# Patient Record
Sex: Male | Born: 1995 | Hispanic: No | Marital: Single | Smoking: Never smoker
Health system: Southern US, Community
[De-identification: ages and names within clinical notes are randomized; demographics above are authoritative.]

## PROBLEM LIST (undated history)

## (undated) ENCOUNTER — Emergency Department (HOSPITAL_COMMUNITY): Admission: EM | Payer: Self-pay | Source: Home / Self Care

## (undated) ENCOUNTER — Emergency Department (HOSPITAL_COMMUNITY): Admission: EM | Discharge status: Absent without leave | Payer: Self-pay

## (undated) HISTORY — PX: SHOULDER ARTHROSCOPY: SHX128

## (undated) HISTORY — PX: HAND SURGERY: SHX662

---
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
## 2014-03-21 ENCOUNTER — Emergency Department (INDEPENDENT_AMBULATORY_CARE_PROVIDER_SITE_OTHER)
Admission: EM | Admit: 2014-03-21 | Discharge: 2014-03-21 | Disposition: A | Discharge status: Home / Self Care | Payer: Self-pay | Source: Home / Self Care | Attending: Family Medicine | Admitting: Family Medicine

## 2014-03-21 ENCOUNTER — Encounter (HOSPITAL_COMMUNITY): Payer: Self-pay | Admitting: Emergency Medicine

## 2014-03-21 DIAGNOSIS — R51 Headache: Secondary | ICD-10-CM

## 2014-03-21 DIAGNOSIS — R519 Headache, unspecified: Secondary | ICD-10-CM

## 2014-03-21 MED ORDER — DICLOFENAC SODIUM 75 MG PO TBEC: 75.0000 mg | DELAYED_RELEASE_TABLET | Freq: Two times a day (BID) | ORAL | Status: AC | PRN

## 2014-03-21 NOTE — ED Notes (Signed)
Reports having a headache with dizziness since Tuesday.    Has not tried any otc meds for symptoms.     States "needs note for work"

## 2014-03-21 NOTE — ED Provider Notes (Signed)
Seth Steele is a 19 y.o. male who presents to Urgent Care today for dizzy. One and a half days ago patient developed a moderate headache and mild room spinning dizziness. The dizziness has completely resolved. No palpitations chest pains or shortness of breath. No medications tried yet. Patient is feeling a bit better now would like to return to work but requires a work note.   History reviewed. No pertinent past medical history. Past Surgical History  Procedure Laterality Date  . Hand surgery     History  Substance Use Topics  . Smoking status: Never Smoker   . Smokeless tobacco: Not on file  . Alcohol Use: No   ROS as above Medications: No current facility-administered medications for this encounter.   Current Outpatient Prescriptions  Medication Sig Dispense Refill  . diclofenac (VOLTAREN) 75 MG EC tablet Take 1 tablet (75 mg total) by mouth 2 (two) times daily as needed. 30 tablet 0   No Known Allergies   Exam:  BP 103/68 mmHg  Pulse 59  Resp 12  SpO2 100% Gen: Well NAD HEENT: EOMI,  MMM PERRL. normal tympanic membranes bilaterally. Lungs: Normal work of breathing. CTABL Heart: RRR no MRG Abd: NABS, Soft. Nondistended, Nontender Exts: Brisk capillary refill, warm and well perfused. Neuro: Alert and oriented cranial nerves II through XII are intact normal coordination strength sensation balance and gait Neck: Nontender to midline normal range of motion   No results found for this or any previous visit (from the past 24 hour(s)). No results found.  Assessment and Plan: 19 y.o. male with headache. Seems to be resolving. Treat with diclofenac. Watchful waiting follow-up as needed. Dizziness has since resolved. No significant physical exam abnormalities.  Discussed warning signs or symptoms. Please see discharge instructions. Patient expresses understanding.     Rodolph BongEvan S Sheikh Leverich, MD 03/21/14 [phone]

## 2014-03-21 NOTE — Discharge Instructions (Signed)
Thank you for coming in today. Go to the emergency room if your headache becomes excruciating or you have weakness or numbness or uncontrolled vomiting.   General Headache Without Cause A headache is pain or discomfort felt around the head or neck area. The specific cause of a headache may not be found. There are many causes and types of headaches. A few common ones are:  Tension headaches.  Migraine headaches.  Cluster headaches.  Chronic daily headaches. HOME CARE INSTRUCTIONS   Keep all follow-up appointments with your caregiver or any specialist referral.  Only take over-the-counter or prescription medicines for pain or discomfort as directed by your caregiver.  Lie down in a dark, quiet room when you have a headache.  Keep a headache journal to find out what may trigger your migraine headaches. For example, write down:  What you eat and drink.  How much sleep you get.  Any change to your diet or medicines.  Try massage or other relaxation techniques.  Put ice packs or heat on the head and neck. Use these 3 to 4 times per day for 15 to 20 minutes each time, or as needed.  Limit stress.  Sit up straight, and do not tense your muscles.  Quit smoking if you smoke.  Limit alcohol use.  Decrease the amount of caffeine you drink, or stop drinking caffeine.  Eat and sleep on a regular schedule.  Get 7 to 9 hours of sleep, or as recommended by your caregiver.  Keep lights dim if bright lights bother you and make your headaches worse. SEEK MEDICAL CARE IF:   You have problems with the medicines you were prescribed.  Your medicines are not working.  You have a change from the usual headache.  You have nausea or vomiting. SEEK IMMEDIATE MEDICAL CARE IF:   Your headache becomes severe.  You have a fever.  You have a stiff neck.  You have loss of vision.  You have muscular weakness or loss of muscle control.  You start losing your balance or have trouble  walking.  You feel faint or pass out.  You have severe symptoms that are different from your first symptoms. MAKE SURE YOU:   Understand these instructions.  Will watch your condition.  Will get help right away if you are not doing well or get worse. Document Released: 02/01/2005 Document Revised: 04/26/2011 Document Reviewed: 02/17/2011 [facility] Patient Information 2015 [place], [place]. This information is not intended to replace advice given to you by your health care provider. Make sure you discuss any questions you have with your health care provider.

## 2014-09-05 ENCOUNTER — Emergency Department (HOSPITAL_COMMUNITY)
Admission: EM | Admit: 2014-09-05 | Discharge: 2014-09-05 | Disposition: A | Discharge status: Home / Self Care | Payer: Self-pay | Attending: Emergency Medicine | Admitting: Emergency Medicine

## 2014-09-05 ENCOUNTER — Encounter (HOSPITAL_COMMUNITY): Payer: Self-pay | Admitting: *Deleted

## 2014-09-05 DIAGNOSIS — N342 Other urethritis: Secondary | ICD-10-CM | POA: Insufficient documentation

## 2014-09-05 LAB — URINE MICROSCOPIC-ADD ON

## 2014-09-05 LAB — URINALYSIS, ROUTINE W REFLEX MICROSCOPIC
BILIRUBIN URINE: NEGATIVE
Glucose, UA: NEGATIVE mg/dL
Hgb urine dipstick: NEGATIVE
Ketones, ur: 15 mg/dL — AB
Nitrite: NEGATIVE
Protein, ur: 30 mg/dL — AB
Specific Gravity, Urine: 1.022 (ref 1.005–1.030)
UROBILINOGEN UA: 1 mg/dL (ref 0.0–1.0)
pH: 7.5 (ref 5.0–8.0)

## 2014-09-05 MED ORDER — CEFTRIAXONE SODIUM 250 MG IJ SOLR
250.0000 mg | Freq: Once | INTRAMUSCULAR | Status: AC
  Administered 2014-09-05: 250 mg via INTRAMUSCULAR
  Filled 2014-09-05: qty 250

## 2014-09-05 MED ORDER — STERILE WATER FOR INJECTION IJ SOLN
INTRAMUSCULAR | Status: AC
  Administered 2014-09-05: 10 mL
  Filled 2014-09-05: qty 10

## 2014-09-05 MED ORDER — AZITHROMYCIN 250 MG PO TABS
1000.0000 mg | ORAL_TABLET | Freq: Once | ORAL | Status: AC
  Administered 2014-09-05: 1000 mg via ORAL
  Filled 2014-09-05: qty 4

## 2014-09-05 MED ORDER — CEPHALEXIN 500 MG PO CAPS: 500.0000 mg | ORAL_CAPSULE | Freq: Two times a day (BID) | ORAL | Status: DC

## 2014-09-05 NOTE — Discharge Instructions (Signed)
Urethritis °Urethritis is an inflammation of the tube through which urine exits your bladder (urethra).  °CAUSES °Urethritis is often caused by an infection in your urethra. The infection can be viral, like herpes. The infection can also be bacterial, like gonorrhea. °RISK FACTORS °Risk factors of urethritis include: °· Having sex without using a condom. °· Having multiple sexual partners. °· Having poor hygiene. °SIGNS AND SYMPTOMS °Symptoms of urethritis are less noticeable in women than in men. These symptoms include: °· Burning feeling when you urinate (dysuria). °· Discharge from your urethra. °· Blood in your urine (hematuria). °· Urinating more than usual. °DIAGNOSIS  °To confirm a diagnosis of urethritis, your health care provider will do the following: °· Ask about your sexual history. °· Perform a physical exam. °· Have you provide a sample of your urine for lab testing. °· Use a cotton swab to gently collect a sample from your urethra for lab testing. °TREATMENT  °It is important to treat urethritis. Depending on the cause, untreated urethritis may lead to serious genital infections and possibly infertility. Urethritis caused by a bacterial infection is treated with antibiotic medicine. All sexual partners must be treated.  °HOME CARE INSTRUCTIONS °· Do not have sex until the test results are known and treatment is completed, even if your symptoms go away before you finish treatment. °· If you were prescribed an antibiotic, finish it all even if you start to feel better. °SEEK MEDICAL CARE IF:  °· Your symptoms are not improved in 3 days. °· Your symptoms are getting worse. °· You develop abdominal pain or pelvic pain (in women). °· You develop joint pain. °· You have a fever. °SEEK IMMEDIATE MEDICAL CARE IF:  °· You have severe pain in the belly, back, or side. °· You have repeated vomiting. °MAKE SURE YOU: °· Understand these instructions. °· Will watch your condition. °· Will get help right away if you  are not doing well or get worse. °Document Released: 07/28/2000 Document Revised: 06/18/2013 Document Reviewed: 10/02/2012 °[employer]® Patient Information ©2015 [employer]. This information is not intended to replace advice given to you by your health care provider. Make sure you discuss any questions you have with your health care provider. ° °

## 2014-09-05 NOTE — ED Notes (Signed)
Pt had unprotected sex three days ago and now c/o dysuria and white discharge. Pt denies n/v/d

## 2014-09-05 NOTE — ED Provider Notes (Signed)
History  This chart was scribed for non-physician practitioner, Teressa Lower, FNP,working with Elwin Mocha, MD, by Karle Plumber, ED Scribe. This patient was seen in room TR09C/TR09C and the patient's care was started at 9:10 PM.   Chief Complaint  Patient presents with  . Exposure to STD  . Dysuria  . Penile Discharge   The history is provided by the patient and medical records. No language interpreter was used.    HPI Comments:  Seth Steele is a 19 y.o. male who presents to the Emergency Department complaining of dysuria that began approximately 3-4 days ago. He reports associated penile discharge. He reports having unprotected sex. He has not done anything to treat his symptoms. He denies modifying factors. He denies fever, chills, nausea or vomiting. He denies allergies to any medications. He denies h/o STDs.  History reviewed. No pertinent past medical history. History reviewed. No pertinent past surgical history. History reviewed. No pertinent family history. History  Substance Use Topics  . Smoking status: Never Smoker   . Smokeless tobacco: Not on file  . Alcohol Use: No    Review of Systems  Constitutional: Negative for fever and chills.  Gastrointestinal: Negative for nausea and vomiting.  Genitourinary: Positive for dysuria and discharge.  All other systems reviewed and are negative.   Allergies  Review of patient's allergies indicates no known allergies.  Home Medications   Prior to Admission medications   Not on File   Triage Vitals: BP 125/65 mmHg  Pulse 83  Temp(Src) 98.2 F (36.8 C) (Oral)  Resp 16  Ht 5\' 8"  (1.727 m)  Wt 150 lb (68.04 kg)  BMI 22.81 kg/m2  SpO2 100% Physical Exam  Constitutional: He is oriented to person, place, and time. He appears well-developed and well-nourished.  HENT:  Head: Normocephalic and atraumatic.  Eyes: EOM are normal.  Neck: Normal range of motion.  Cardiovascular: Normal rate.   Pulmonary/Chest: Effort  normal.  Abdominal: Soft. Bowel sounds are normal. There is no tenderness.  Genitourinary:  Exam chaperoned by ED Scribe. Green penile discharge  Musculoskeletal: Normal range of motion.  Neurological: He is alert and oriented to person, place, and time.  Skin: Skin is warm and dry.  Psychiatric: He has a normal mood and affect. His behavior is normal.  Nursing note and vitals reviewed.   ED Course  Procedures (including critical care time) DIAGNOSTIC STUDIES: Oxygen Saturation is 100% on RA, normal by my interpretation.   COORDINATION OF CARE: 9:13 PM- Will test and treat for GC/chlamydia. Pt verbalizes understanding and agrees to plan.  Medications - No data to display  Labs Review Labs Reviewed  URINALYSIS, ROUTINE W REFLEX MICROSCOPIC (NOT AT [facility]) - Abnormal; Notable for the following:    APPearance CLOUDY (*)    Ketones, ur 15 (*)    Protein, ur 30 (*)    Leukocytes, UA LARGE (*)    All other components within normal limits  URINE MICROSCOPIC-ADD ON  HIV ANTIBODY (ROUTINE TESTING)  RPR  GC/CHLAMYDIA PROBE AMP ([place]) NOT AT [facility]    Imaging Review No results found.   EKG Interpretation None      MDM   Final diagnoses:  Urethritis    Pt given rocephin and zithromax in the ed. Pt educated on safe sex practices. Will send home with keflex  I personally performed the services described in this documentation, which was scribed in my presence. The recorded information has been reviewed and is accurate.    Teressa Lower,  NP 09/05/14 2220  Elwin Mocha, MD 09/05/14 2312

## 2014-09-06 LAB — RPR: RPR: NONREACTIVE

## 2014-09-06 LAB — GC/CHLAMYDIA PROBE AMP (~~LOC~~) NOT AT ARMC
CHLAMYDIA, DNA PROBE: NEGATIVE
Neisseria Gonorrhea: POSITIVE — AB

## 2014-09-06 LAB — HIV ANTIBODY (ROUTINE TESTING W REFLEX): HIV Screen 4th Generation wRfx: NONREACTIVE

## 2014-09-09 ENCOUNTER — Telehealth (HOSPITAL_COMMUNITY): Payer: Self-pay

## 2014-09-09 NOTE — ED Notes (Signed)
Positive for gonorrhea. Treated per protocol. [employer] form faxed. Attempting to notify pt

## 2014-09-15 ENCOUNTER — Emergency Department (HOSPITAL_COMMUNITY)
Admission: EM | Admit: 2014-09-15 | Discharge: 2014-09-15 | Disposition: A | Discharge status: Home / Self Care | Payer: Medicaid Other | Attending: Emergency Medicine | Admitting: Emergency Medicine

## 2014-09-15 ENCOUNTER — Encounter (HOSPITAL_COMMUNITY): Payer: Self-pay | Admitting: *Deleted

## 2014-09-15 ENCOUNTER — Telehealth (HOSPITAL_COMMUNITY): Payer: Self-pay | Admitting: Emergency Medicine

## 2014-09-15 DIAGNOSIS — Y9389 Activity, other specified: Secondary | ICD-10-CM | POA: Insufficient documentation

## 2014-09-15 DIAGNOSIS — Y998 Other external cause status: Secondary | ICD-10-CM | POA: Insufficient documentation

## 2014-09-15 DIAGNOSIS — IMO0002 Reserved for concepts with insufficient information to code with codable children: Secondary | ICD-10-CM

## 2014-09-15 DIAGNOSIS — Y9289 Other specified places as the place of occurrence of the external cause: Secondary | ICD-10-CM | POA: Insufficient documentation

## 2014-09-15 DIAGNOSIS — W25XXXA Contact with sharp glass, initial encounter: Secondary | ICD-10-CM | POA: Insufficient documentation

## 2014-09-15 DIAGNOSIS — Z23 Encounter for immunization: Secondary | ICD-10-CM | POA: Insufficient documentation

## 2014-09-15 DIAGNOSIS — S51811A Laceration without foreign body of right forearm, initial encounter: Secondary | ICD-10-CM | POA: Insufficient documentation

## 2014-09-15 MED ORDER — TETANUS-DIPHTH-ACELL PERTUSSIS 5-2.5-18.5 LF-MCG/0.5 IM SUSP
0.5000 mL | Freq: Once | INTRAMUSCULAR | Status: AC
  Administered 2014-09-15: 0.5 mL via INTRAMUSCULAR
  Filled 2014-09-15: qty 0.5

## 2014-09-15 MED ORDER — LIDOCAINE-EPINEPHRINE 1 %-1:100000 IJ SOLN
20.0000 mL | Freq: Once | INTRAMUSCULAR | Status: AC
  Administered 2014-09-15: 20 mL
  Filled 2014-09-15: qty 1

## 2014-09-15 NOTE — ED Notes (Signed)
Declined W/C at D/C and was escorted to lobby by RN. 

## 2014-09-15 NOTE — ED Notes (Signed)
Seth Steele at bedside for Lac repair.

## 2014-09-15 NOTE — Telephone Encounter (Signed)
Unable to contact patient by phone regarding lab results. Letter sent 
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
## 2014-09-15 NOTE — Discharge Instructions (Signed)
Please monitor for signs of infection, follow-up with her primary care provider for reevaluation if any concerning signs or symptoms present. Please return to the emergency room as needed. Sutures will dissolve on their own. He is read attached information
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
## 2014-09-30 ENCOUNTER — Telehealth (HOSPITAL_COMMUNITY): Payer: Self-pay | Admitting: *Deleted

## 2014-10-14 ENCOUNTER — Encounter (HOSPITAL_COMMUNITY): Payer: Self-pay | Admitting: Emergency Medicine

## 2014-10-14 ENCOUNTER — Emergency Department (HOSPITAL_COMMUNITY)
Admission: EM | Admit: 2014-10-14 | Discharge: 2014-10-14 | Disposition: A | Discharge status: Home / Self Care | Payer: Self-pay | Attending: Emergency Medicine | Admitting: Emergency Medicine

## 2014-10-14 DIAGNOSIS — R369 Urethral discharge, unspecified: Secondary | ICD-10-CM | POA: Insufficient documentation

## 2014-10-14 LAB — RAPID HIV SCREEN (HIV 1/2 AB+AG)
HIV 1/2 ANTIBODIES: NONREACTIVE
HIV-1 P24 ANTIGEN - HIV24: NONREACTIVE

## 2014-10-14 LAB — URINE MICROSCOPIC-ADD ON

## 2014-10-14 LAB — URINALYSIS, ROUTINE W REFLEX MICROSCOPIC
BILIRUBIN URINE: NEGATIVE
Glucose, UA: NEGATIVE mg/dL
Hgb urine dipstick: NEGATIVE
Ketones, ur: NEGATIVE mg/dL
NITRITE: NEGATIVE
PH: 7 (ref 5.0–8.0)
Protein, ur: NEGATIVE mg/dL
SPECIFIC GRAVITY, URINE: 1.014 (ref 1.005–1.030)
UROBILINOGEN UA: 0.2 mg/dL (ref 0.0–1.0)

## 2014-10-14 MED ORDER — CEFTRIAXONE SODIUM 250 MG IJ SOLR
250.0000 mg | Freq: Once | INTRAMUSCULAR | Status: AC
  Administered 2014-10-14: 250 mg via INTRAMUSCULAR
  Filled 2014-10-14: qty 250

## 2014-10-14 MED ORDER — AZITHROMYCIN 250 MG PO TABS
1000.0000 mg | ORAL_TABLET | Freq: Once | ORAL | Status: AC
  Administered 2014-10-14: 1000 mg via ORAL
  Filled 2014-10-14: qty 4

## 2014-10-14 MED ORDER — ONDANSETRON 4 MG PO TBDP
4.0000 mg | ORAL_TABLET | Freq: Once | ORAL | Status: AC
  Administered 2014-10-14: 4 mg via ORAL
  Filled 2014-10-14: qty 1

## 2014-10-14 NOTE — ED Notes (Signed)
Patient advised we need a urine.   Phlebotomy on the way to get blood.

## 2014-10-14 NOTE — Discharge Instructions (Signed)

## 2014-10-14 NOTE — ED Provider Notes (Signed)
CSN: [id number]     Arrival date & time 10/14/14  1150 History  This chart was scribed for non-physician practitioner, Marlon Pel, PA-C, working with [name], MD, by Ronney Lion, ED Scribe. This patient was seen in room TR11C/TR11C and the patient's care was started at 1:17 PM.    Chief Complaint  Patient presents with  . SEXUALLY TRANSMITTED DISEASE   The history is provided by the patient. No language interpreter was used.   Seth Steele 19 y.o.male  PCP: No PCP Per Patient  Blood pressure 99/68, pulse 74, temperature 98.2 F (36.8 C), temperature source Oral, resp. rate 22, height 5\' 7"  (1.702 m), weight 160 lb (72.576 kg), SpO2 99 %.  SIGNIFICANT PMH: None CHIEF COMPLAINT: Penile discharge and burning dysuria  When: Yesterday How: Pt states he had sex 3 weeks ago and the condom broke. Yesterday, he began experiencing burning dysuria and green-yellow penile discharge. Frequency: New-onset Duration: 1 day Location: Penile Radiation: N/A Quality: Green-yellow penile discharge Alleviating factors: None  Worsening factors: None Treatments tried: None Associated Symptoms: N/A; Pt states he is otherwise asymptomatic Negative ROS: Testicular or penile pain or swelling, back pain, abdominal, nausea, vomiting, diarrhea, Confusion, diaphoresis, fever, headache, weakness (general or focal), change of vision,  neck pain, dysphagia, aphagia, chest pain, shortness of breath,  lower extremity swelling, rash.   History reviewed. No pertinent past medical history. Past Surgical History  Procedure Laterality Date  . Hand surgery     No family history on file. Social History  Substance Use Topics  . Smoking status: Never Smoker   . Smokeless tobacco: None  . Alcohol Use: No    Review of Systems A complete 10 system review of systems was obtained and all systems are negative except as noted in the HPI and PMH.    Allergies  Review of patient's allergies indicates no known  allergies.  Home Medications   Prior to Admission medications   Medication Sig Start Date End Date Taking? Authorizing Provider  diclofenac (VOLTAREN) 75 MG EC tablet Take 1 tablet (75 mg total) by mouth 2 (two) times daily as needed. 03/21/14   Rodolph Bong, MD   BP 99/68 mmHg  Pulse 74  Temp(Src) 98.2 F (36.8 C) (Oral)  Resp 22  Ht 5\' 7"  (1.702 m)  Wt 160 lb (72.576 kg)  BMI 25.05 kg/m2  SpO2 99% Physical Exam  Constitutional: He is oriented to person, place, and time. He appears well-developed and well-nourished. No distress.  HENT:  Head: Normocephalic and atraumatic.  Eyes: Conjunctivae and EOM are normal.  Neck: Neck supple. No tracheal deviation present.  Cardiovascular: Normal rate.   Pulmonary/Chest: Effort normal. No respiratory distress.  Genitourinary: Discharge found.  Musculoskeletal: Normal range of motion.  Neurological: He is alert and oriented to person, place, and time.  Skin: Skin is warm and dry.  Psychiatric: He has a normal mood and affect. His behavior is normal.  Nursing note and vitals reviewed.   ED Course  Procedures (including critical care time)  DIAGNOSTIC STUDIES: Oxygen Saturation is 99% on RA, normal by my interpretation.    COORDINATION OF CARE: 1:22 PM - Discussed treatment plan with pt at bedside which includes STD testing. Pt verbalized understanding and agreed to plan.   Labs Review Labs Reviewed  URINALYSIS, ROUTINE W REFLEX MICROSCOPIC (NOT AT [facility])  RAPID HIV SCREEN (HIV 1/2 AB+AG)  RPR  GC/CHLAMYDIA PROBE AMP () NOT AT [facility]      MDM  Final diagnoses:  Penile discharge   RPR, GC, HIV pending  Advised to practice safe sex and have all partners evaluated and treated at the local health department. Also advised to follow with the health Department in 1-2 weeks to confirm effectiveness of treatment and receive additional education/evaluation. Return precautions given.  Medications  azithromycin (ZITHROMAX)  tablet 1,000 mg (not administered)  cefTRIAXone (ROCEPHIN) injection 250 mg (not administered)  ondansetron (ZOFRAN-ODT) disintegrating tablet 4 mg (not administered)    19 y.o.[name]'s evaluation in the Emergency Department is complete. It has been determined that no acute conditions requiring further emergency intervention are present at this time. The patient/guardian have been advised of the diagnosis and plan. We have discussed signs and symptoms that warrant return to the ED, such as changes or worsening in symptoms.  Vital signs are stable at discharge. Filed Vitals:   10/14/14 1214  BP: 99/68  Pulse: 74  Temp: 98.2 F (36.8 C)  Resp: 22    Patient/guardian has voiced understanding and agreed to follow-up with the PCP or specialist.  I personally performed the services described in this documentation, which was scribed in my presence. The recorded information has been reviewed and is accurate.    Marlon Pel, PA-C 10/14/14 1327  [name], MD 10/14/14 [phone]

## 2014-10-14 NOTE — ED Notes (Signed)
Patient states condom broke when he was having sex x 3 weeks ago.  Patient states he now has discharge and burning when he urinates.

## 2014-10-15 LAB — GC/CHLAMYDIA PROBE AMP (~~LOC~~) NOT AT ARMC
CHLAMYDIA, DNA PROBE: POSITIVE — AB
Neisseria Gonorrhea: POSITIVE — AB

## 2014-10-15 LAB — RPR: RPR Ser Ql: NONREACTIVE

## 2014-10-16 ENCOUNTER — Telehealth (HOSPITAL_BASED_OUTPATIENT_CLINIC_OR_DEPARTMENT_OTHER): Payer: Self-pay | Admitting: Emergency Medicine

## 2014-10-16 ENCOUNTER — Telehealth (HOSPITAL_COMMUNITY): Payer: Self-pay | Admitting: Emergency Medicine

## 2014-10-16 NOTE — Telephone Encounter (Signed)
ID verified. patient notified of positive Gonorrhea and Chlamydia and that treatment was given while in ED. STD instructions provided, patient verbalized understanding.

## 2014-11-04 ENCOUNTER — Emergency Department (HOSPITAL_COMMUNITY)
Admission: EM | Admit: 2014-11-04 | Discharge: 2014-11-04 | Disposition: A | Discharge status: Home / Self Care | Payer: Medicaid Other | Attending: Emergency Medicine | Admitting: Emergency Medicine

## 2014-11-04 ENCOUNTER — Emergency Department (HOSPITAL_COMMUNITY): Payer: Self-pay

## 2014-11-04 ENCOUNTER — Encounter (HOSPITAL_COMMUNITY): Payer: Self-pay | Admitting: *Deleted

## 2014-11-04 DIAGNOSIS — W1839XA Other fall on same level, initial encounter: Secondary | ICD-10-CM | POA: Insufficient documentation

## 2014-11-04 DIAGNOSIS — Y998 Other external cause status: Secondary | ICD-10-CM | POA: Insufficient documentation

## 2014-11-04 DIAGNOSIS — S6991XA Unspecified injury of right wrist, hand and finger(s), initial encounter: Secondary | ICD-10-CM | POA: Insufficient documentation

## 2014-11-04 DIAGNOSIS — Y9289 Other specified places as the place of occurrence of the external cause: Secondary | ICD-10-CM | POA: Insufficient documentation

## 2014-11-04 DIAGNOSIS — Z87891 Personal history of nicotine dependence: Secondary | ICD-10-CM | POA: Insufficient documentation

## 2014-11-04 DIAGNOSIS — Y9389 Activity, other specified: Secondary | ICD-10-CM | POA: Insufficient documentation

## 2014-11-04 MED ORDER — NAPROXEN 500 MG PO TABS: 500.0000 mg | ORAL_TABLET | Freq: Two times a day (BID) | ORAL | Status: AC
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
## 2014-11-04 NOTE — ED Notes (Signed)
Pt is here with right hand and pinkie finger injury from 2 days ago when he fell on his hand.  Pt has some swelling to top of hand and has pain with trying to move pinkie.

## 2014-11-04 NOTE — ED Provider Notes (Signed)
CSN: [id number]     Arrival date & time 11/04/14  1057 History   This chart was scribed for non-physician practitioner [name], PA-C working with [name], MD by Murriel Hopper, ED Scribe. This patient was seen in room TR05C/TR05C and the patient's care was started at 2:04 PM.     Chief Complaint  Patient presents with  . Hand Injury     The history is provided by the patient. No language interpreter was used.   HPI Comments: Seth Steele is a 19 y.o. male who presents to the Emergency Department complaining of constant, worsening lateral right hand pain with associated hand swelling and numbness in his 5th digit that has been present for 2 days after pt injured it when he fell at work. Pt states he cannot work, and notes his pain worsens with movement. Pt denies doing anything to treat his pain, and states that where he is from, they do not take medication. Pt states he has never broken his hand before.  He denies any pain of the wrist or pain anywhere else.    History reviewed. No pertinent past medical history. Past Surgical History  Procedure Laterality Date  . Hand surgery     No family history on file. Social History  Substance Use Topics  . Smoking status: Former Games developer  . Smokeless tobacco: None  . Alcohol Use: No    Review of Systems  Constitutional: Negative for fever and chills.  Musculoskeletal: Positive for joint swelling and arthralgias.  Neurological: Positive for numbness.  All other systems reviewed and are negative.     Allergies  Review of patient's allergies indicates no known allergies.  Home Medications   Prior to Admission medications   Medication Sig Start Date End Date Taking? Authorizing Provider  diclofenac (VOLTAREN) 75 MG EC tablet Take 1 tablet (75 mg total) by mouth 2 (two) times daily as needed. 03/21/14   Rodolph Bong, MD   BP 124/71 mmHg  Pulse 59  Temp(Src) 97.4 F (36.3 C) (Oral)  Resp 18  SpO2 100% Physical Exam   Constitutional: He is oriented to person, place, and time. He appears well-developed and well-nourished.  HENT:  Head: Normocephalic and atraumatic.  Cardiovascular: Normal rate and regular rhythm.   Pulses:      Radial pulses are 2+ on the right side.  Pulmonary/Chest: Effort normal and breath sounds normal.  Abdominal: He exhibits no distension.  Musculoskeletal:       Right wrist: He exhibits normal range of motion, no tenderness, no bony tenderness and no swelling.  Tenderness to palpation of the 4th and 5th metacarpals with obvious deformity present Able to make a fist with the right hand  Good capillary refill of right 5th digit Decreased sensation of the right 5th digit  Neurological: He is alert and oriented to person, place, and time.  Skin: Skin is warm, dry and intact.  Psychiatric: He has a normal mood and affect.  Nursing note and vitals reviewed.   ED Course  Procedures (including critical care time)  DIAGNOSTIC STUDIES: Oxygen Saturation is 100% on room air, normal by my interpretation.    COORDINATION OF CARE: 2:08 PM Discussed treatment plan with pt at bedside and pt agreed to plan.   Labs Review Labs Reviewed - No data to display  Imaging Review Dg Hand Complete Right  11/04/2014   CLINICAL DATA:  Right hand and little finger injury 2 days ago, falling on right hand.  EXAM: RIGHT HAND -  COMPLETE 3+ VIEW  COMPARISON:  None.  FINDINGS: Old healed fractures of the fourth and fifth metacarpals. No acute fracture, subluxation or dislocation. Joint spaces are maintained.  IMPRESSION: No acute bony abnormality.   Electronically Signed   By: Charlett Nose M.D.   On: 11/04/2014 12:56   I have personally reviewed and evaluated these images and lab results as part of my medical decision-making.   EKG Interpretation None      MDM   Final diagnoses:  None  Patient presents today with right hand pain after falling while at work.  Xray negative for acute fracture,  but showing old fractures.  Patient with obvious old deformity of his hand.  Patient given velcro splint for comfort.  Patient stable for discharge.  Given referral to Hand Surgery.  Stable for discharge.  Return precautions given  I personally performed the services described in this documentation, which was scribed in my presence. The recorded information has been reviewed and is accurate.    [name], PA-C 11/05/14 2343  [name], MD 11/05/14 [phone]

## 2014-11-12 LAB — GLUCOSE, POCT (MANUAL RESULT ENTRY): POC GLUCOSE: 101 mg/dL — AB (ref 70–99)

## 2015-02-03 ENCOUNTER — Encounter (HOSPITAL_COMMUNITY): Payer: Self-pay | Admitting: Family Medicine

## 2015-02-03 ENCOUNTER — Emergency Department (HOSPITAL_COMMUNITY)
Admission: EM | Admit: 2015-02-03 | Discharge: 2015-02-03 | Disposition: A | Discharge status: Home / Self Care | Payer: Self-pay | Attending: Emergency Medicine | Admitting: Emergency Medicine

## 2015-02-03 DIAGNOSIS — R197 Diarrhea, unspecified: Secondary | ICD-10-CM | POA: Insufficient documentation

## 2015-02-03 DIAGNOSIS — R109 Unspecified abdominal pain: Secondary | ICD-10-CM | POA: Insufficient documentation

## 2015-02-03 DIAGNOSIS — R1110 Vomiting, unspecified: Secondary | ICD-10-CM | POA: Insufficient documentation

## 2015-02-03 LAB — COMPREHENSIVE METABOLIC PANEL
ALT: 53 U/L (ref 17–63)
AST: 33 U/L (ref 15–41)
Albumin: 4.4 g/dL (ref 3.5–5.0)
Alkaline Phosphatase: 122 U/L (ref 38–126)
Anion gap: 11 (ref 5–15)
BILIRUBIN TOTAL: 1.3 mg/dL — AB (ref 0.3–1.2)
BUN: 11 mg/dL (ref 6–20)
CHLORIDE: 103 mmol/L (ref 101–111)
CO2: 24 mmol/L (ref 22–32)
CREATININE: 1.23 mg/dL (ref 0.61–1.24)
Calcium: 9.3 mg/dL (ref 8.9–10.3)
Glucose, Bld: 103 mg/dL — ABNORMAL HIGH (ref 65–99)
POTASSIUM: 3.8 mmol/L (ref 3.5–5.1)
Sodium: 138 mmol/L (ref 135–145)
TOTAL PROTEIN: 7.7 g/dL (ref 6.5–8.1)

## 2015-02-03 LAB — URINALYSIS, ROUTINE W REFLEX MICROSCOPIC
GLUCOSE, UA: NEGATIVE mg/dL
Ketones, ur: 40 mg/dL — AB
NITRITE: POSITIVE — AB
PH: 5.5 (ref 5.0–8.0)
Protein, ur: 30 mg/dL — AB
Specific Gravity, Urine: 1.041 — ABNORMAL HIGH (ref 1.005–1.030)

## 2015-02-03 LAB — CBC
HCT: 48.8 % (ref 39.0–52.0)
Hemoglobin: 16.9 g/dL (ref 13.0–17.0)
MCH: 30.3 pg (ref 26.0–34.0)
MCHC: 34.6 g/dL (ref 30.0–36.0)
MCV: 87.6 fL (ref 78.0–100.0)
Platelets: 141 10*3/uL — ABNORMAL LOW (ref 150–400)
RBC: 5.57 MIL/uL (ref 4.22–5.81)
RDW: 12.1 % (ref 11.5–15.5)
WBC: 9.5 10*3/uL (ref 4.0–10.5)

## 2015-02-03 LAB — URINE MICROSCOPIC-ADD ON

## 2015-02-03 LAB — LIPASE, BLOOD: LIPASE: 34 U/L (ref 11–51)

## 2015-02-03 MED ORDER — DIPHENOXYLATE-ATROPINE 2.5-0.025 MG PO TABS: 1.0000 | ORAL_TABLET | Freq: Four times a day (QID) | ORAL | Status: AC | PRN

## 2015-02-03 MED ORDER — LOPERAMIDE HCL 2 MG PO CAPS
4.0000 mg | ORAL_CAPSULE | Freq: Once | ORAL | Status: AC
Start: 2015-02-03 — End: 2015-02-03
  Administered 2015-02-03: 4 mg via ORAL
  Filled 2015-02-03: qty 2

## 2015-02-03 MED ORDER — ONDANSETRON 4 MG PO TBDP
4.0000 mg | ORAL_TABLET | Freq: Once | ORAL | Status: AC
  Administered 2015-02-03: 4 mg via ORAL
  Filled 2015-02-03: qty 1

## 2015-02-03 MED ORDER — ONDANSETRON 4 MG PO TBDP: 4.0000 mg | ORAL_TABLET | Freq: Three times a day (TID) | ORAL | Status: AC | PRN

## 2015-02-03 NOTE — ED Notes (Signed)
Pt is in stable condition upon d/c and ambulates from ED. 

## 2015-02-03 NOTE — ED Notes (Signed)
Pt here for abd pain, headache and diarrhea since yesterday.

## 2015-02-03 NOTE — ED Provider Notes (Signed)
CSN: [id number]     Arrival date & time 02/03/15  1345 History   First MD Initiated Contact with Patient 02/03/15 1552     Chief Complaint  Patient presents with  . Diarrhea  . Abdominal Pain     (Consider location/radiation/quality/duration/timing/severity/associated sxs/prior Treatment) Patient is a 19 y.o. male presenting with diarrhea and abdominal pain. The history is provided by the patient. No language interpreter was used.  Diarrhea Quality:  Watery Severity:  Moderate Onset quality:  Gradual Number of episodes:  Multiple Duration:  2 days Timing:  Intermittent Progression:  Worsening Relieved by:  Nothing Worsened by:  Nothing tried Ineffective treatments:  None tried Associated symptoms: abdominal pain and vomiting   Risk factors: no recent antibiotic use   Abdominal Pain Associated symptoms: diarrhea and vomiting     History reviewed. No pertinent past medical history. No past surgical history on file. History reviewed. No pertinent family history. Social History  Substance Use Topics  . Smoking status: Never Smoker   . Smokeless tobacco: None  . Alcohol Use: No    Review of Systems  Gastrointestinal: Positive for vomiting, abdominal pain and diarrhea.  All other systems reviewed and are negative.     Allergies  Review of patient's allergies indicates no known allergies.  Home Medications   Prior to Admission medications   Medication Sig Start Date End Date Taking? Authorizing Provider  cephALEXin (KEFLEX) 500 MG capsule Take 1 capsule (500 mg total) by mouth 2 (two) times daily. Patient not taking: Reported on 02/03/2015 09/05/14   Teressa Lower, NP   BP 109/67 mmHg  Pulse 101  Temp(Src) 99.1 F (37.3 C) (Oral)  Resp 14  SpO2 100% Physical Exam  Constitutional: He is oriented to person, place, and time. He appears well-developed and well-nourished.  HENT:  Head: Normocephalic and atraumatic.  Right Ear: External ear normal.  Left Ear:  External ear normal.  Mouth/Throat: Oropharynx is clear and moist.  Eyes: Conjunctivae and EOM are normal. Pupils are equal, round, and reactive to light.  Neck: Normal range of motion.  Cardiovascular: Normal rate and normal heart sounds.   Pulmonary/Chest: Effort normal and breath sounds normal.  Abdominal: Soft. He exhibits no distension and no mass. There is tenderness. There is no rebound and no guarding.  diffussely tender  Musculoskeletal: Normal range of motion.  Neurological: He is alert and oriented to person, place, and time.  Psychiatric: He has a normal mood and affect.  Nursing note and vitals reviewed.   ED Course  Procedures (including critical care time) Labs Review Labs Reviewed  COMPREHENSIVE METABOLIC PANEL - Abnormal; Notable for the following:    Glucose, Bld 103 (*)    Total Bilirubin 1.3 (*)    All other components within normal limits  CBC - Abnormal; Notable for the following:    Platelets 141 (*)    All other components within normal limits  LIPASE, BLOOD  URINALYSIS, ROUTINE W REFLEX MICROSCOPIC (NOT AT [facility])    Imaging Review No results found. I have personally reviewed and evaluated these images and lab results as part of my medical decision-making.   EKG Interpretation None      MDM  Pt's symptoms consistent with viral gi virus.   Pt given rx for zofran odt.  Referral by resource guide.   Pt advised to return if symtpoms worsen or cahnge.   Final diagnoses:  Diarrhea, unspecified type    zofran odt immodium An After Visit Summary was printed and  given to the patient.    Lonia SkinnerLeslie K BiwabikSofia, PA-C 02/03/15 1730  Cathren LaineKevin Steinl, MD 02/04/15 [phone]

## 2015-02-03 NOTE — Discharge Instructions (Signed)

## 2015-09-03 ENCOUNTER — Encounter (HOSPITAL_COMMUNITY): Payer: Self-pay | Admitting: Emergency Medicine

## 2015-09-03 ENCOUNTER — Ambulatory Visit (HOSPITAL_COMMUNITY)
Admission: EM | Admit: 2015-09-03 | Discharge: 2015-09-03 | Disposition: A | Discharge status: Home / Self Care | Payer: Self-pay | Attending: Emergency Medicine | Admitting: Emergency Medicine

## 2015-09-03 DIAGNOSIS — M779 Enthesopathy, unspecified: Principal | ICD-10-CM

## 2015-09-03 DIAGNOSIS — M778 Other enthesopathies, not elsewhere classified: Secondary | ICD-10-CM

## 2015-09-03 DIAGNOSIS — M6588 Other synovitis and tenosynovitis, other site: Secondary | ICD-10-CM

## 2015-09-03 MED ORDER — MELOXICAM 15 MG PO TABS: 15.0000 mg | ORAL_TABLET | Freq: Every day | ORAL | Status: AC

## 2015-09-03 NOTE — ED Notes (Signed)
[facility] with a complaint of right hand pain secondary to an injury that occurred playing soccer a year ago.

## 2015-09-03 NOTE — Discharge Instructions (Signed)
It looks like you had a broken bone in your hand many years ago. When it healed, it created a bony prominence. This bony prominence is causing irritation to the tendons in your hand which is causing your pain. Take meloxicam daily for 1 week, then as needed for pain. Wear the wrist splint if you are going to be doing lifting at work. At the end of the day, please ice your hand for 20 minutes. Follow-up as needed.

## 2015-09-03 NOTE — ED Provider Notes (Signed)
CSN: [id number]     Arrival date & time 09/03/15  0957 History   First MD Initiated Contact with Patient 09/03/15 1015     Chief Complaint  Patient presents with  . Hand Pain   (Consider location/radiation/quality/duration/timing/severity/associated sxs/prior Treatment) HPI  He is a 20 year old man here for evaluation of right hand pain. He states many years ago he injured his hand playing soccer. At some point he had an x-ray that showed it had been broken. 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  This causes pain and discomfort on the dorsal hand between the fourth and fifth metacarpal bones.  He states if he is not lifting heavy things or doing certain movements he does not have any pain. No swelling. No new injury or trauma. He has not tried any medications. He states his job is able to move him to a different position, but requires a note.  History reviewed. No pertinent past medical history. History reviewed. No pertinent past surgical history. History reviewed. No pertinent family history. Social History  Substance Use Topics  . Smoking status: Never Smoker   . Smokeless tobacco: None  . Alcohol Use: No    Review of Systems As in history of present illness Allergies  Review of patient's allergies indicates no known allergies.  Home Medications   Prior to Admission medications   Medication Sig Start Date End Date Taking? Authorizing Provider  diphenoxylate-atropine (LOMOTIL) 2.5-0.025 MG tablet Take 1 tablet by mouth 4 (four) times daily as needed for diarrhea or loose stools. 02/03/15   Elson AreasLeslie K Sofia, PA-C  meloxicam (MOBIC) 15 MG tablet Take 1 tablet (15 mg total) by mouth daily. For 1 week, then as needed for pain 09/03/15   Charm RingsErin J Delshon Blanchfield, MD  ondansetron (ZOFRAN ODT) 4 MG disintegrating tablet Take 1 tablet (4 mg total) by mouth every 8 (eight) hours as needed for nausea or vomiting. 02/03/15   Elson AreasLeslie K  Sofia, PA-C   Meds Ordered and Administered this Visit  Medications - No data to display  BP 106/54 mmHg  Pulse 59  Temp(Src) 97.9 F (36.6 C) (Oral)  Resp 16  SpO2 99% No data found.   Physical Exam  Constitutional: He is oriented to person, place, and time. He appears well-developed and well-nourished. No distress.  Cardiovascular: Normal rate.   Pulmonary/Chest: Effort normal.  Musculoskeletal:  Right hand: No erythema or edema. He does have a bony prominence at the mid to distal fourth medical carpal.  This is nontender. He is tender in the soft tissues between the fourth and fifth metacarpals. Full active range of motion in hand and wrist. 2+ radial pulse. Brisk cap refill in all digits.  Neurological: He is alert and oriented to person, place, and time.    ED Course  Procedures (including critical care time)  Labs Review Labs Reviewed - No data to display  Imaging Review No results found.  MDM   1. Right hand tendonitis    This is likely some tendinitis coming from irritation from the bony prominence. Recommended meloxicam, wrist brace, and icing. Work note provided limiting lifting and movement of the right hand for the next 2 weeks. Follow-up as needed.    Charm RingsErin J Thao Bauza, MD 09/03/15 1032

## 2016-09-01 ENCOUNTER — Encounter (HOSPITAL_COMMUNITY): Payer: Self-pay | Admitting: Nurse Practitioner

## 2016-09-01 ENCOUNTER — Emergency Department (HOSPITAL_COMMUNITY)
Admission: EM | Admit: 2016-09-01 | Discharge: 2016-09-01 | Disposition: A | Discharge status: Home / Self Care | Payer: BLUE CROSS/BLUE SHIELD | Attending: Emergency Medicine | Admitting: Emergency Medicine

## 2016-09-01 DIAGNOSIS — R51 Headache: Secondary | ICD-10-CM | POA: Diagnosis not present

## 2016-09-01 DIAGNOSIS — Z791 Long term (current) use of non-steroidal anti-inflammatories (NSAID): Secondary | ICD-10-CM | POA: Diagnosis not present

## 2016-09-01 DIAGNOSIS — R0981 Nasal congestion: Secondary | ICD-10-CM

## 2016-09-01 DIAGNOSIS — Z79899 Other long term (current) drug therapy: Secondary | ICD-10-CM | POA: Diagnosis not present

## 2016-09-01 MED ORDER — LORATADINE 10 MG PO TABS: 10.0000 mg | ORAL_TABLET | Freq: Every day | ORAL | 0 refills | Status: AC

## 2016-09-01 MED ORDER — FLUTICASONE PROPIONATE 50 MCG/ACT NA SUSP: 1.0000 | Freq: Every day | NASAL | 2 refills | Status: AC

## 2016-09-01 NOTE — ED Triage Notes (Signed)
Pt presents with headache, rhinorrhea, and non-productive dry cough ongoing since yesterday. Pt denies sick contacts. Patient has not tried any OTC meds presently. Denies fevers, chills, nausea, light sensitivity, sinus pressure.

## 2016-09-01 NOTE — Discharge Instructions (Signed)
Please read attached information. If you experience any new or worsening signs or symptoms please return to the emergency room for evaluation. Please follow-up with your primary care provider or specialist as discussed. Please use medication prescribed only as directed and discontinue taking if you have any concerning signs or symptoms.   °

## 2016-09-01 NOTE — ED Provider Notes (Signed)
[facility]-EMERGENCY DEPT Provider Note   CSN: [id number] Arrival date & time: 09/01/16  1144     History   Chief Complaint Chief Complaint  Patient presents with  . Nasal Congestion  . Headache    HPI Seth Steele is a 21 y.o. male.  HPI   21 year old male presents today with complaints of upper respiratory congestion.  Patient reports symptoms started 2 days ago with runny nose, congestion, sore throat and dry nonproductive cough.  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  Patient denies any difficulty swallowing, chest pain or shortness of breath, denies any fever or chills.  No medications prior to arrival.  Patient reports symptoms are improving over the course of the day.  History reviewed. No pertinent past medical history.  There are no active problems to display for this patient.   Past Surgical History:  Procedure Laterality Date  . SHOULDER ARTHROSCOPY Right        Home Medications    Prior to Admission medications   Medication Sig Start Date End Date Taking? Authorizing Provider  diphenoxylate-atropine (LOMOTIL) 2.5-0.025 MG tablet Take 1 tablet by mouth 4 (four) times daily as needed for diarrhea or loose stools. 02/03/15   Elson Areas, PA-C  fluticasone (FLONASE) 50 MCG/ACT nasal spray Place 1 spray into both nostrils daily. 09/01/16   Briannie Gutierrez, Tinnie Gens, PA-C  loratadine (CLARITIN) 10 MG tablet Take 1 tablet (10 mg total) by mouth daily. 09/01/16   Zi Sek, Tinnie Gens, PA-C  meloxicam (MOBIC) 15 MG tablet Take 1 tablet (15 mg total) by mouth daily. For 1 week, then as needed for pain 09/03/15   Charm Rings, MD  ondansetron (ZOFRAN ODT) 4 MG disintegrating tablet Take 1 tablet (4 mg total) by mouth every 8 (eight) hours as needed for nausea or vomiting. 02/03/15   Elson Areas, PA-C    Family History No family history on file.  Social History Social History    Substance Use Topics  . Smoking status: Never Smoker  . Smokeless tobacco: Never Used  . Alcohol use No     Allergies   Patient has no known allergies.   Review of Systems Review of Systems  All other systems reviewed and are negative.    Physical Exam Updated Vital Signs BP (!) 108/57 (BP Location: Right Arm)   Pulse (!) 55   Temp 98 F (36.7 C) (Oral)   Resp 14   Ht 5\' 7"  (1.702 m)   Wt 68 kg (150 lb)   SpO2 100%   BMI 23.49 kg/m   Physical Exam  Constitutional: He is oriented to person, place, and time. He appears well-developed and well-nourished.  HENT:  Head: Normocephalic and atraumatic.  Right Ear: Tympanic membrane and ear canal normal.  Left Ear: Tympanic membrane and ear canal normal.  Nose: Rhinorrhea present.  Mouth/Throat: Uvula is midline, oropharynx is clear and moist and mucous membranes are normal. He does not have dentures. No oral lesions. No trismus in the jaw. Normal dentition. No dental abscesses, uvula swelling, lacerations or dental caries.  Eyes: Pupils are equal, round, and reactive to light. Conjunctivae are normal. Right eye exhibits no discharge. Left eye exhibits no discharge. No scleral icterus.  Neck: Normal range of motion. No JVD present. No tracheal deviation present.  Pulmonary/Chest: Effort normal. No stridor.  Neurological: He is alert and oriented to person, place, and time. Coordination  normal.  Psychiatric: He has a normal mood and affect. His behavior is normal. Judgment and thought content normal.  Nursing note and vitals reviewed.    ED Treatments / Results  Labs (all labs ordered are listed, but only abnormal results are displayed) Labs Reviewed - No data to display  EKG  EKG Interpretation None       Radiology No results found.  Procedures Procedures (including critical care time)  Medications Ordered in ED Medications - No data to display   Initial Impression / Assessment and Plan / ED Course  I  have reviewed the triage vital signs and the nursing notes.  Pertinent labs & imaging results that were available during my care of the patient were reviewed by me and considered in my medical decision making (see chart for details).      Final Clinical Impressions(s) / ED Diagnoses   Final diagnoses:  Nasal congestion    [age] year old male presents today with nasal congestion.  Uncertain if this is allergies versus early viral infection.  He is well-appearing in no acute distress.  No objective findings to necessitate further evaluation or management here in the ED.  He will be started on Claritin, Flonase, outpatient follow-up with strict return precautions.  Patient verbalized understanding and agreement to today's plan had no further questions or concerns the time discharge.  New Prescriptions New Prescriptions   FLUTICASONE (FLONASE) 50 MCG/ACT NASAL SPRAY    Place 1 spray into both nostrils daily.   LORATADINE (CLARITIN) 10 MG TABLET    Take 1 tablet (10 mg total) by mouth daily.     Eyvonne MechanicHedges, Aasiya Creasey, PA-C 09/01/16 1229    Raeford RazorKohut, Stephen, MD 09/11/16 1310

## 2017-01-10 ENCOUNTER — Emergency Department (HOSPITAL_COMMUNITY)
Admission: EM | Admit: 2017-01-10 | Discharge: 2017-01-10 | Disposition: A | Discharge status: Home / Self Care | Payer: BLUE CROSS/BLUE SHIELD | Attending: Physician Assistant | Admitting: Physician Assistant

## 2017-01-10 ENCOUNTER — Other Ambulatory Visit: Payer: Self-pay

## 2017-01-10 ENCOUNTER — Encounter (HOSPITAL_COMMUNITY): Payer: Self-pay

## 2017-01-10 DIAGNOSIS — R1084 Generalized abdominal pain: Secondary | ICD-10-CM | POA: Diagnosis not present

## 2017-01-10 DIAGNOSIS — Z87891 Personal history of nicotine dependence: Secondary | ICD-10-CM | POA: Insufficient documentation

## 2017-01-10 DIAGNOSIS — Z79899 Other long term (current) drug therapy: Secondary | ICD-10-CM | POA: Diagnosis not present

## 2017-01-10 DIAGNOSIS — R109 Unspecified abdominal pain: Secondary | ICD-10-CM | POA: Diagnosis present

## 2017-01-10 LAB — COMPREHENSIVE METABOLIC PANEL
ALBUMIN: 4.4 g/dL (ref 3.5–5.0)
ALT: 52 U/L (ref 17–63)
ANION GAP: 7 (ref 5–15)
AST: 32 U/L (ref 15–41)
Alkaline Phosphatase: 86 U/L (ref 38–126)
BUN: 11 mg/dL (ref 6–20)
CALCIUM: 9.4 mg/dL (ref 8.9–10.3)
CHLORIDE: 103 mmol/L (ref 101–111)
CO2: 28 mmol/L (ref 22–32)
Creatinine, Ser: 0.86 mg/dL (ref 0.61–1.24)
GFR calc non Af Amer: >60 mL/min (ref >60)
GLUCOSE: 96 mg/dL (ref 65–99)
POTASSIUM: 4.2 mmol/L (ref 3.5–5.1)
SODIUM: 138 mmol/L (ref 135–145)
Total Bilirubin: 0.9 mg/dL (ref 0.3–1.2)
Total Protein: 7.5 g/dL (ref 6.5–8.1)

## 2017-01-10 LAB — CBC
HEMATOCRIT: 45.4 % (ref 39.0–52.0)
HEMOGLOBIN: 15.5 g/dL (ref 13.0–17.0)
MCH: 29.8 pg (ref 26.0–34.0)
MCHC: 34.1 g/dL (ref 30.0–36.0)
MCV: 87.3 fL (ref 78.0–100.0)
Platelets: 167 10*3/uL (ref 150–400)
RBC: 5.2 MIL/uL (ref 4.22–5.81)
RDW: 12.4 % (ref 11.5–15.5)
WBC: 4.8 10*3/uL (ref 4.0–10.5)

## 2017-01-10 LAB — LIPASE, BLOOD: LIPASE: 36 U/L (ref 11–51)

## 2017-01-10 LAB — URINALYSIS, ROUTINE W REFLEX MICROSCOPIC
Bilirubin Urine: NEGATIVE
Glucose, UA: NEGATIVE mg/dL
HGB URINE DIPSTICK: NEGATIVE
Ketones, ur: NEGATIVE mg/dL
Leukocytes, UA: NEGATIVE
Nitrite: NEGATIVE
PH: 5 (ref 5.0–8.0)
Protein, ur: NEGATIVE mg/dL
SPECIFIC GRAVITY, URINE: 1.025 (ref 1.005–1.030)

## 2017-01-10 MED ORDER — DICYCLOMINE HCL 20 MG PO TABS: 20.0000 mg | ORAL_TABLET | Freq: Two times a day (BID) | ORAL | 0 refills | Status: AC

## 2017-01-10 MED ORDER — DICYCLOMINE HCL 10 MG PO CAPS
20.0000 mg | ORAL_CAPSULE | Freq: Once | ORAL | Status: AC
  Administered 2017-01-10: 20 mg via ORAL
  Filled 2017-01-10: qty 2

## 2017-01-10 NOTE — ED Provider Notes (Signed)
[facility] Provider Note   CSN: [id number] Arrival date & time: 01/10/17  0745     History   Chief Complaint Chief Complaint  Patient presents with  . Abdominal Pain    HPI Seth Steele is a 21 y.o. male.  HPI   Seth Steele is a 21 y.o. male, patient with no pertinent past medical history, presenting to the ED with intermittent abdominal discomfort and diarrhea for past two months.  Discomfort is cramping, generalized abdomen, mild to moderate, nonradiating. Pain comes on with eating and resolves with BM. Has been drinking more water during this time and states he has been urinating more as well.   Denies fever/chills, dysuria, hematuria, back pain, N/V, hematochezia/melena, pain with BM, or any other complaints.   History reviewed. No pertinent past medical history.  There are no active problems to display for this patient.   Past Surgical History:  Procedure Laterality Date  . HAND SURGERY         Home Medications    Prior to Admission medications   Medication Sig Start Date End Date Taking? Authorizing Provider  diclofenac (VOLTAREN) 75 MG EC tablet Take 1 tablet (75 mg total) by mouth 2 (two) times daily as needed. 03/21/14   Rodolph Bongorey, Evan S, MD  naproxen (NAPROSYN) 500 MG tablet Take 1 tablet (500 mg total) by mouth 2 (two) times daily. 11/04/14   Santiago GladLaisure, Heather, PA-C    Family History No family history on file.  Social History Social History   Tobacco Use  . Smoking status: Former Smoker  Substance Use Topics  . Alcohol use: No  . Drug use: No     Allergies   Patient has no known allergies.   Review of Systems Review of Systems  Constitutional: Negative for chills and fever.  Respiratory: Negative for shortness of breath.   Cardiovascular: Negative for chest pain.  Gastrointestinal: Positive for abdominal pain and diarrhea. Negative for blood in stool, nausea and vomiting.  Genitourinary: Negative for  dysuria and hematuria.  Musculoskeletal: Negative for back pain.  Neurological: Negative for dizziness and light-headedness.  All other systems reviewed and are negative.    Physical Exam Updated Vital Signs BP 103/68 (BP Location: Right Arm)   Pulse 62   Temp 98.2 F (36.8 C) (Oral)   Resp 16   Ht 5\' 6"  (1.676 m)   Wt 68 kg (150 lb)   SpO2 100%   BMI 24.21 kg/m   Physical Exam  Constitutional: He appears well-developed and well-nourished. No distress.  HENT:  Head: Normocephalic and atraumatic.  Eyes: Conjunctivae are normal.  Neck: Neck supple.  Cardiovascular: Normal rate, regular rhythm, normal heart sounds and intact distal pulses.  Pulmonary/Chest: Effort normal and breath sounds normal. No respiratory distress.  Abdominal: Soft. Bowel sounds are normal. There is no tenderness. There is no guarding.  Patient is noted to be lounging on the bed playing with his phone.  He changes positions without noted hesitation or signs of discomfort.  Musculoskeletal: He exhibits no edema.  Lymphadenopathy:    He has no cervical adenopathy.  Neurological: He is alert.  Skin: Skin is warm and dry. Capillary refill takes less than 2 seconds. He is not diaphoretic.  Psychiatric: He has a normal mood and affect. His behavior is normal.  Nursing note and vitals reviewed.    ED Treatments / Results  Labs (all labs ordered are listed, but only abnormal results are displayed) Labs Reviewed  LIPASE,  BLOOD  COMPREHENSIVE METABOLIC PANEL  CBC  URINALYSIS, ROUTINE W REFLEX MICROSCOPIC    EKG  EKG Interpretation None       Radiology No results found.  Procedures Procedures (including critical care time)  Medications Ordered in ED Medications  dicyclomine (BENTYL) capsule 20 mg (not administered)     Initial Impression / Assessment and Plan / ED Course  I have reviewed the triage vital signs and the nursing notes.  Pertinent labs & imaging results that were available  during my care of the patient were reviewed by me and considered in my medical decision making (see chart for details).  Clinical Course as of Jan 10 1058  Mon Jan 10, 2017  1033 Patient not yet in the room.  [SJ]    Clinical Course User Index [SJ] Keona Sheffler C, PA-C    Patient presents with intermittent abdominal pain and loose stools. Patient is nontoxic appearing, afebrile, not tachycardic, not tachypneic, and is in no apparent distress.  Lab results reassuring.  Doubt surgical abdomen.  Able to tolerate PO.  PCP versus GI follow-up.  Resources given. The patient was given instructions for home care as well as return precautions. Patient voices understanding of these instructions, accepts the plan, and is comfortable with discharge.     Final Clinical Impressions(s) / ED Diagnoses   Final diagnoses:  Generalized abdominal pain    ED Discharge Orders    None       Concepcion LivingJoy, Khalif Stender C, PA-C 01/10/17 1059    Mackuen, Cindee Saltourteney Lyn, MD 01/11/17 [phone]
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
## 2017-01-10 NOTE — Discharge Instructions (Addendum)
Your symptoms may be connected with diet.  Your lab results today were reassuring. Continue to drink adequate amounts of water to stay well hydrated.  May use the Bentyl, as needed for abdominal discomfort. Please follow the dietary instructions attached.  Start with a clear liquid diet and then progressed to a full liquid and then a bland, solid diet as your symptoms allow. Follow-up with primary care provider and/or the gastroenterologist on this matter.

## 2017-05-13 IMAGING — DX DG HAND COMPLETE 3+V*R*
3 series · 3 of 3 positions shown · non-contrast
Comparison: None.

CLINICAL DATA: Right hand and little finger injury 2 days ago,
falling on right hand.

EXAM:
RIGHT HAND - COMPLETE 3+ VIEW

[hand ap]
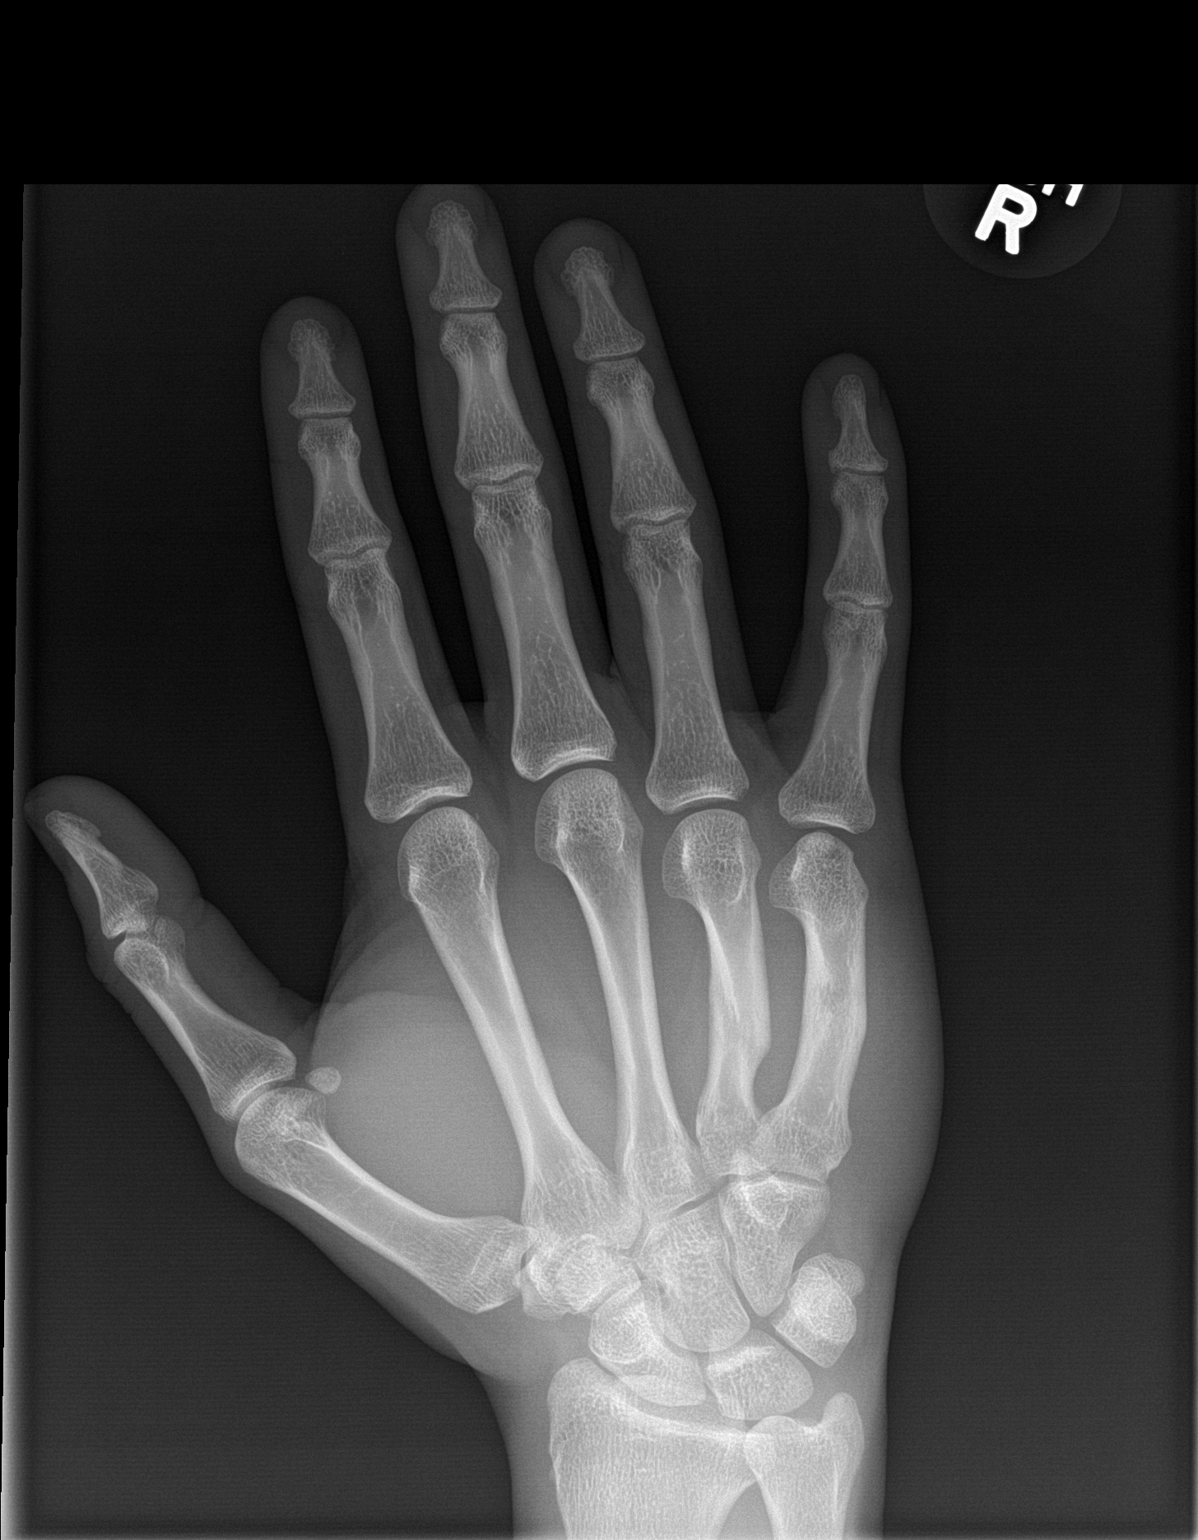

[hand obl]
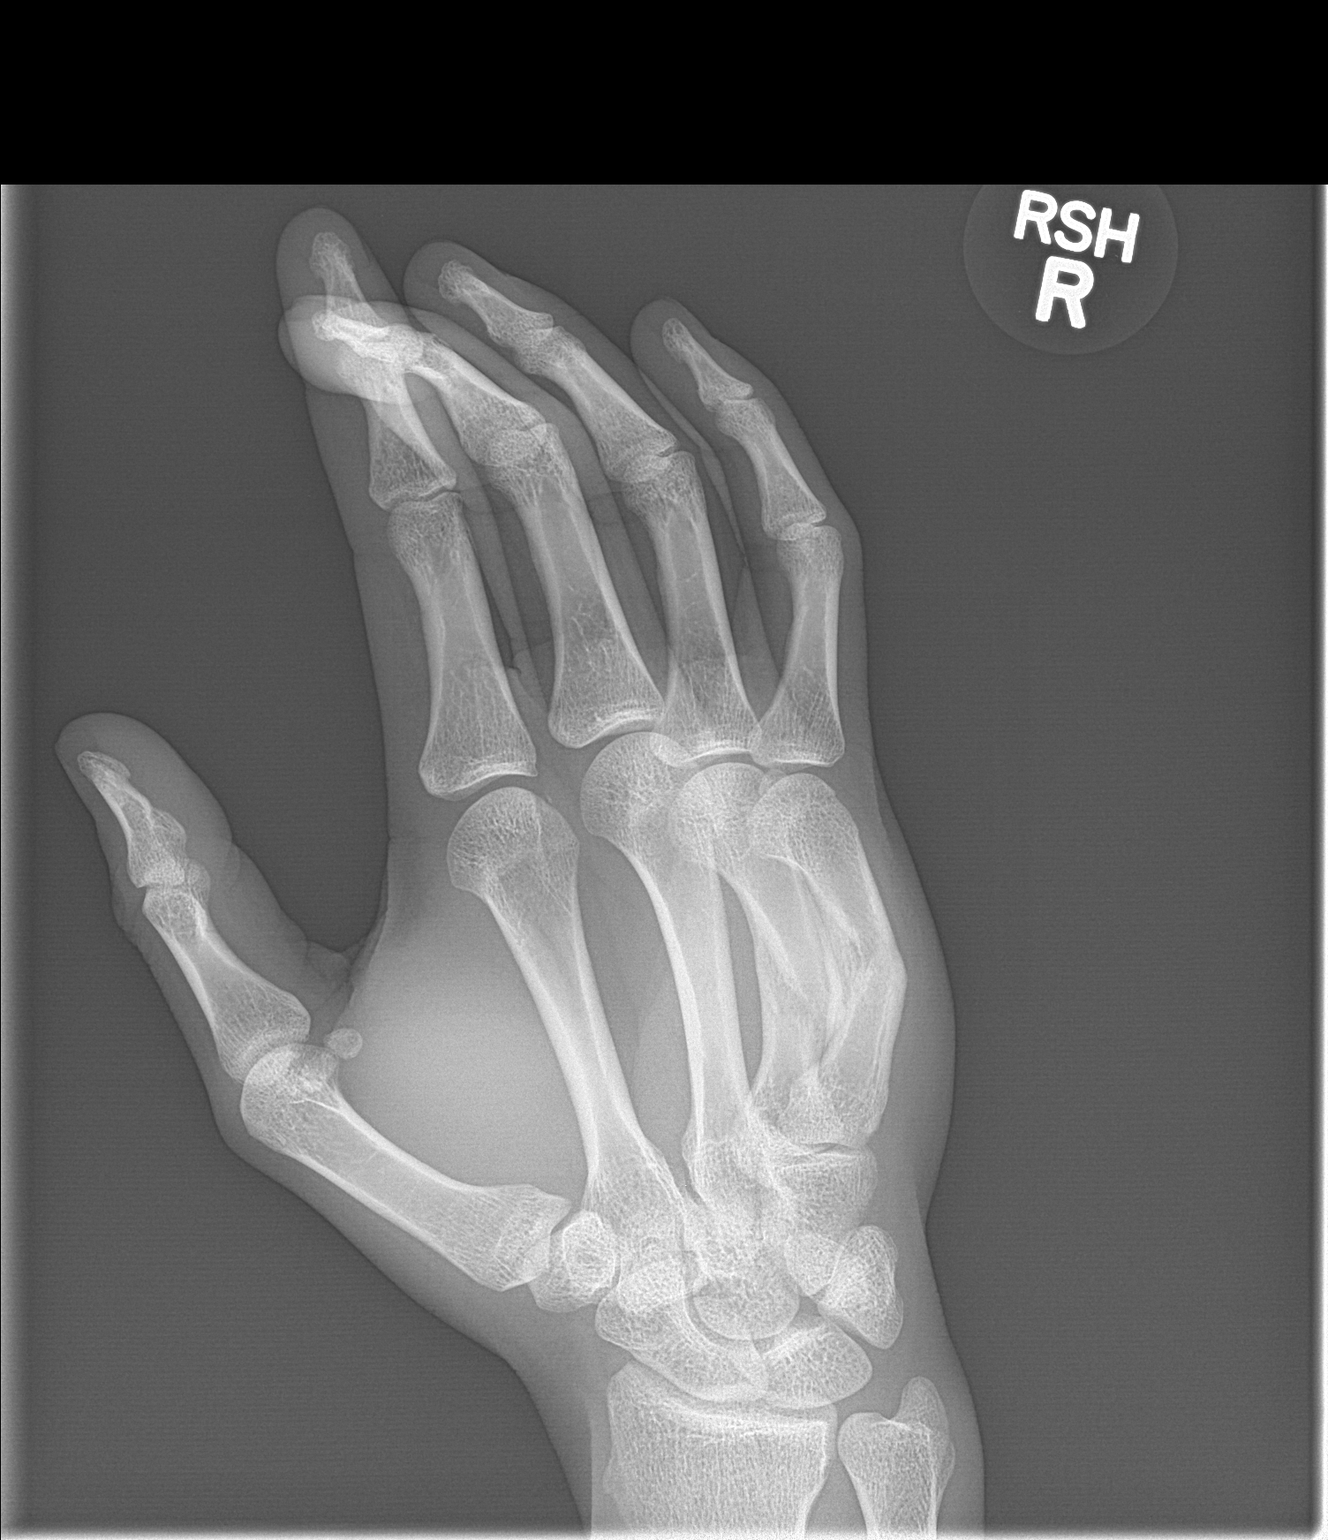

[hand lat]
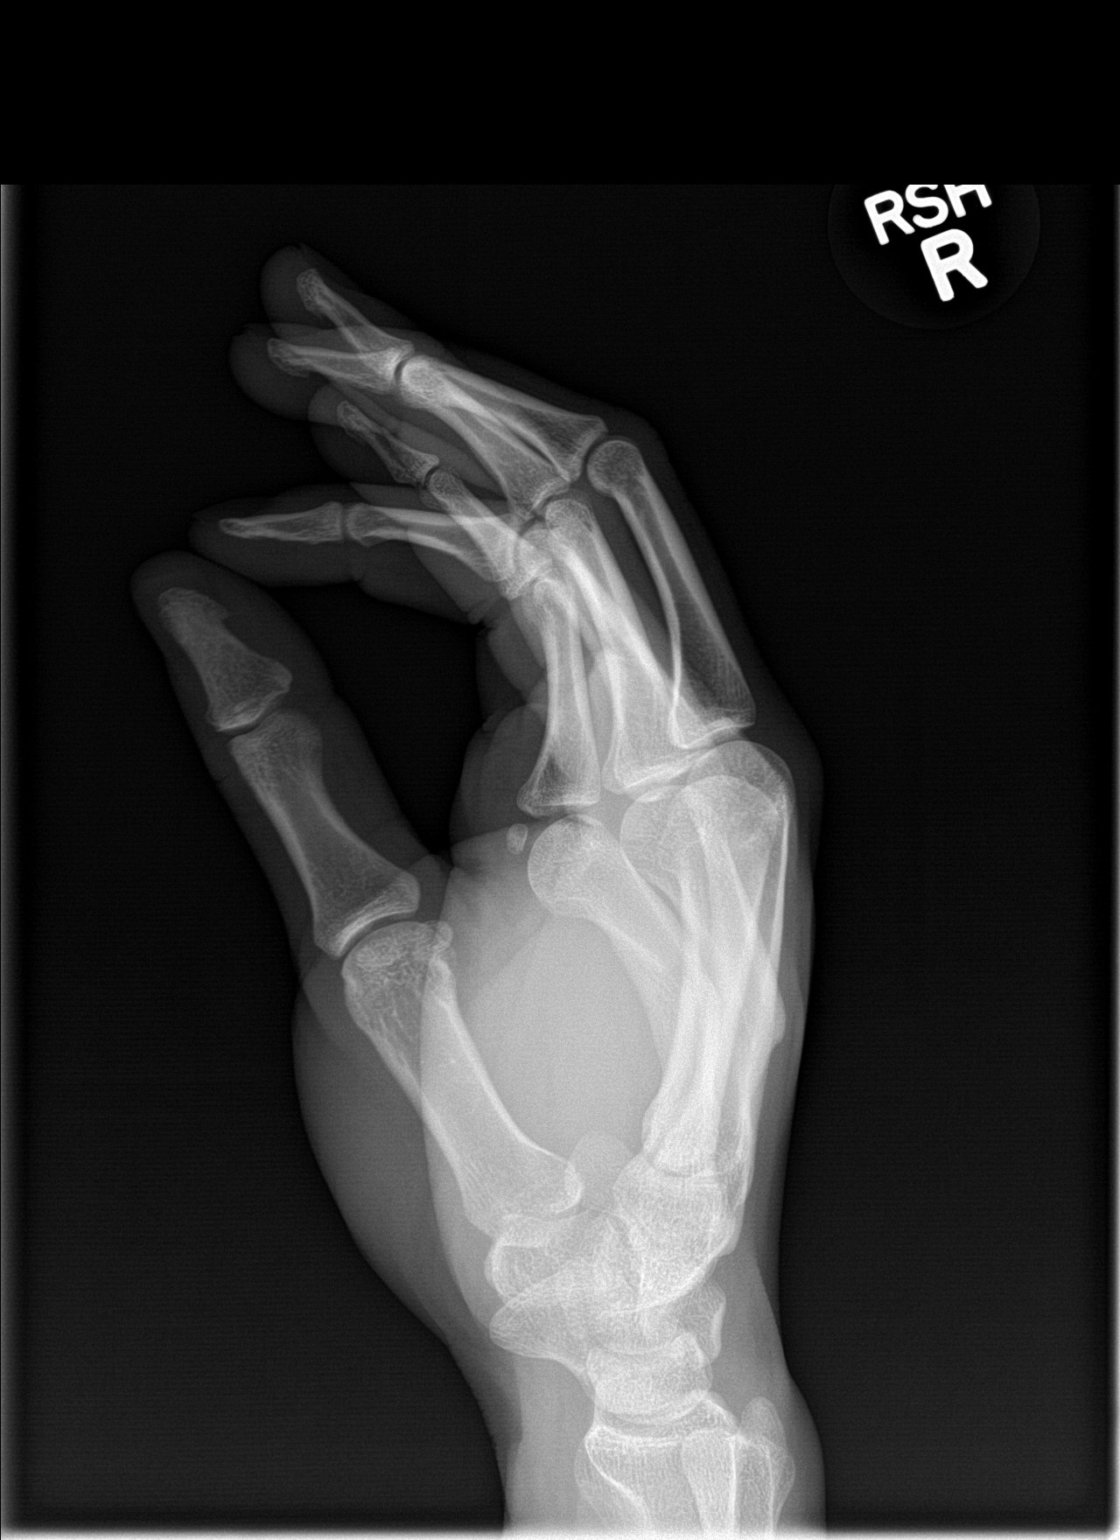

[3 of 3 positions shown; findings below may reference images not displayed]

FINDINGS: Old healed fractures of the fourth and fifth metacarpals. No acute
fracture, subluxation or dislocation. Joint spaces are maintained.
IMPRESSION: No acute bony abnormality.

## 2017-05-21 ENCOUNTER — Emergency Department (HOSPITAL_COMMUNITY)
Admission: EM | Admit: 2017-05-21 | Discharge: 2017-05-21 | Disposition: A | Discharge status: Home / Self Care | Payer: BLUE CROSS/BLUE SHIELD | Attending: Emergency Medicine | Admitting: Emergency Medicine

## 2017-05-21 ENCOUNTER — Encounter (HOSPITAL_COMMUNITY): Payer: Self-pay | Admitting: *Deleted

## 2017-05-21 ENCOUNTER — Other Ambulatory Visit: Payer: Self-pay

## 2017-05-21 DIAGNOSIS — Z202 Contact with and (suspected) exposure to infections with a predominantly sexual mode of transmission: Secondary | ICD-10-CM | POA: Diagnosis not present

## 2017-05-21 DIAGNOSIS — R300 Dysuria: Secondary | ICD-10-CM | POA: Diagnosis present

## 2017-05-21 DIAGNOSIS — Z79899 Other long term (current) drug therapy: Secondary | ICD-10-CM | POA: Diagnosis not present

## 2017-05-21 DIAGNOSIS — L299 Pruritus, unspecified: Secondary | ICD-10-CM

## 2017-05-21 LAB — URINALYSIS, ROUTINE W REFLEX MICROSCOPIC
Bilirubin Urine: NEGATIVE
Glucose, UA: NEGATIVE mg/dL
Hgb urine dipstick: NEGATIVE
Ketones, ur: NEGATIVE mg/dL
Leukocytes, UA: NEGATIVE
NITRITE: NEGATIVE
PH: 7 (ref 5.0–8.0)
Protein, ur: NEGATIVE mg/dL
SPECIFIC GRAVITY, URINE: 1.018 (ref 1.005–1.030)

## 2017-05-21 MED ORDER — AZITHROMYCIN 250 MG PO TABS
1000.0000 mg | ORAL_TABLET | Freq: Once | ORAL | Status: AC
Start: 2017-05-21 — End: 2017-05-21
  Administered 2017-05-21: 1000 mg via ORAL
  Filled 2017-05-21: qty 4

## 2017-05-21 MED ORDER — LORATADINE 10 MG PO TABS: 10.0000 mg | ORAL_TABLET | Freq: Every day | ORAL | 0 refills | Status: AC

## 2017-05-21 MED ORDER — STERILE WATER FOR INJECTION IJ SOLN
INTRAMUSCULAR | Status: AC
  Filled 2017-05-21: qty 10

## 2017-05-21 MED ORDER — CEFTRIAXONE SODIUM 250 MG IJ SOLR
250.0000 mg | Freq: Once | INTRAMUSCULAR | Status: AC
  Administered 2017-05-21: 250 mg via INTRAMUSCULAR
  Filled 2017-05-21: qty 250

## 2017-05-21 NOTE — ED Provider Notes (Signed)
[facility] EMERGENCY DEPARTMENT Provider Note   CSN: [id number] Arrival date & time: 05/21/17  1624     History   Chief Complaint Chief Complaint  Patient presents with  . Dysuria    HPI Seth Steele is a 22 y.o. male presenting for evaluation of dysuria and generalized itching.  Patient states that on Monday, he started to develop dysuria.  He has irritation at the tip of his penis every time he urinates.  Today the irritation was worse.  He noticed white drainage from his penis earlier today.  He states he had similar symptoms in 2015 when he was diagnosed with an STD.  He denies fevers, chills, nausea, vomiting, abdominal pain.  He denies hematuria or urinary frequency.  He is sexually active with one male partner who is asymptommatic.  Additionally, patient states that he has had generalized body itching over the past week.  He started using a new body wash, think this might be contributing.  No other new environments, detergents, soaps, shampoos, or exposures.  No history of similar.  He has not taken anything for it.  No one else at home with a rash.  No known allergies.  HPI  History reviewed. No pertinent past medical history.  There are no active problems to display for this patient.   Past Surgical History:  Procedure Laterality Date  . HAND SURGERY          Home Medications    Prior to Admission medications   Medication Sig Start Date End Date Taking? Authorizing Provider  bismuth subsalicylate (PEPTO BISMOL) 262 MG/15ML suspension Take 30 mLs by mouth every 6 (six) hours as needed for indigestion.    [provider]  calcium carbonate (TUMS - DOSED IN MG ELEMENTAL CALCIUM) 500 MG chewable tablet Chew 1 tablet by mouth daily as needed for indigestion or heartburn.    [provider]  diclofenac (VOLTAREN) 75 MG EC tablet Take 1 tablet (75 mg total) by mouth 2 (two) times daily as needed. Patient not taking: Reported on  01/10/2017 03/21/14   Rodolph Bong, MD  dicyclomine (BENTYL) 20 MG tablet Take 1 tablet (20 mg total) by mouth 2 (two) times daily. 01/10/17   Joy, Shawn C, PA-C  loratadine (CLARITIN) 10 MG tablet Take 1 tablet (10 mg total) by mouth daily. 05/21/17   Henryetta Corriveau, PA-C  naproxen (NAPROSYN) 500 MG tablet Take 1 tablet (500 mg total) by mouth 2 (two) times daily. Patient not taking: Reported on 01/10/2017 11/04/14   [name], PA-C    Family History No family history on file.  Social History Social History   Tobacco Use  . Smoking status: Never Smoker  . Smokeless tobacco: Never Used  Substance Use Topics  . Alcohol use: No  . Drug use: No     Allergies   Patient has no known allergies.   Review of Systems Review of Systems  Constitutional: Negative for chills and fever.  Genitourinary: Positive for dysuria. Negative for frequency and hematuria.  Skin: Positive for rash.     Physical Exam Updated Vital Signs BP 106/61   Pulse 70   Temp 98.5 F (36.9 C)   Resp 16   Ht 5\' 6"  (1.676 m)   Wt 68 kg (150 lb)   SpO2 98%   BMI 24.21 kg/m   Physical Exam  Constitutional: He is oriented to person, place, and time. He appears well-developed and well-nourished. No distress.  HENT:  Head: Normocephalic  and atraumatic.  Eyes: EOM are normal.  Neck: Normal range of motion.  Cardiovascular: Normal rate, regular rhythm and intact distal pulses.  Pulmonary/Chest: Effort normal and breath sounds normal. No respiratory distress. He has no wheezes.  Abdominal: Soft. He exhibits no distension and no mass. There is no tenderness. There is no guarding.  Genitourinary: Testes normal and penis normal.  Genitourinary Comments: Chaperone present.  No tenderness to palpation of the penis or testicles.  No obvious discharge.  No inguinal lymphadenopathy.  Musculoskeletal: Normal range of motion.  Neurological: He is alert and oriented to person, place, and time.  Skin: Skin is  warm.  No obvious rash noted.  3 or 4 discrete lesions noted on bilateral hands.    Psychiatric: He has a normal mood and affect.  Nursing note and vitals reviewed.    ED Treatments / Results  Labs (all labs ordered are listed, but only abnormal results are displayed) Labs Reviewed  URINALYSIS, ROUTINE W REFLEX MICROSCOPIC  GC/CHLAMYDIA PROBE AMP ([place]    EKG None  Radiology No results found.  Procedures Procedures (including critical care time)  Medications Ordered in ED Medications  sterile water (preservative free) injection (has no administration in time range)  cefTRIAXone (ROCEPHIN) injection 250 mg (250 mg Intramuscular Given 05/21/17 1858)  azithromycin (ZITHROMAX) tablet 1,000 mg (1,000 mg Oral Given 05/21/17 1858)     Initial Impression / Assessment and Plan / ED Course  I have reviewed the triage vital signs and the nursing notes.  Pertinent labs & imaging results that were available during my care of the patient were reviewed by me and considered in my medical decision making (see chart for details).     Patient presenting for evaluation of dysuria and itching.  Physical exam shows no concerning lesions or discharge in the penis.  As patient is reporting discharge and concern for STD, will treat today.  Discussed that results for gonorrhea and chlamydia are pending.  Discussed that his partner should follow-up if results are positive.  Additionally, patient presenting for itching.  No significant rash found.  No sign of anaphylaxis.  Will treat with Claritin and patient to stop using new body wash.  At this time, patient appears safe for discharge.  Return precautions given.  Patient states he understands and agrees to plan.   Final Clinical Impressions(s) / ED Diagnoses   Final diagnoses:  Dysuria  Itching  Possible exposure to STD    ED Discharge Orders        Ordered    loratadine (CLARITIN) 10 MG tablet  Daily     05/21/17 1854         Alveria ApleyCaccavale, Vianna Venezia, PA-C 05/21/17 2100    Nira Connardama, Pedro Eduardo, MD 05/22/17 1309

## 2017-05-21 NOTE — Discharge Instructions (Signed)
Take Claritin once a day to help with itching. Stop using the new body soap. You will receive a phone call if the results of your gonorrhea and Chlamydia tests were positive today.  If they were negative, you will not receive a phone call.  Abstain from sexual intercourse for the next 2 weeks to prevent reinfection.  If results are positive, your partner should also get treatment.  This can be done at the health department. Return to the emergency room if you develop fevers, vomiting, difficulty breathing, or any new or concerning symptoms.

## 2017-05-21 NOTE — ED Triage Notes (Signed)
The pt has had painful urination since Monday and he is  Itching over his body

## 2017-05-23 LAB — GC/CHLAMYDIA PROBE AMP (~~LOC~~) NOT AT ARMC
Chlamydia: NEGATIVE
NEISSERIA GONORRHEA: NEGATIVE

## 2017-05-24 ENCOUNTER — Encounter (HOSPITAL_COMMUNITY): Payer: Self-pay | Admitting: *Deleted

## 2017-05-30 ENCOUNTER — Other Ambulatory Visit: Payer: Self-pay

## 2017-05-30 ENCOUNTER — Emergency Department (HOSPITAL_COMMUNITY)
Admission: EM | Admit: 2017-05-30 | Discharge: 2017-05-30 | Disposition: A | Discharge status: Home / Self Care | Payer: BLUE CROSS/BLUE SHIELD | Attending: Physician Assistant | Admitting: Physician Assistant

## 2017-05-30 DIAGNOSIS — Z79899 Other long term (current) drug therapy: Secondary | ICD-10-CM | POA: Diagnosis not present

## 2017-05-30 DIAGNOSIS — J069 Acute upper respiratory infection, unspecified: Secondary | ICD-10-CM | POA: Insufficient documentation

## 2017-05-30 DIAGNOSIS — R0981 Nasal congestion: Secondary | ICD-10-CM | POA: Diagnosis present

## 2017-05-30 NOTE — ED Triage Notes (Signed)
pt has productive cough, nasal congestion and sore throat x 3days.

## 2017-05-30 NOTE — ED Provider Notes (Signed)
[facility] EMERGENCY DEPARTMENT Provider Note   CSN: [id number] Arrival date & time: 05/30/17  7829     History   Chief Complaint Chief Complaint  Patient presents with  . URI    HPI Seth Steele is a 22 y.o. male presenting to the ED with 3 days of nasal congestion and cough.  Patient states symptoms started gradually and have been persistent.  He has taken over-the-counter cold medicine without much relief.  He states his throat is mildly sore, denies any significant productive sputum to his cough.  Denies fever or chills, ear pain, difficulty breathing or swallowing.  Up-to-date on immunizations.  The history is provided by the patient.    No past medical history on file.  There are no active problems to display for this patient.   Past Surgical History:  Procedure Laterality Date  . HAND SURGERY    . SHOULDER ARTHROSCOPY Right         Home Medications    Prior to Admission medications   Medication Sig Start Date End Date Taking? Authorizing Provider  bismuth subsalicylate (PEPTO BISMOL) 262 MG/15ML suspension Take 30 mLs by mouth every 6 (six) hours as needed for indigestion.    [provider]  calcium carbonate (TUMS - DOSED IN MG ELEMENTAL CALCIUM) 500 MG chewable tablet Chew 1 tablet by mouth daily as needed for indigestion or heartburn.    [provider]  diclofenac (VOLTAREN) 75 MG EC tablet Take 1 tablet (75 mg total) by mouth 2 (two) times daily as needed. Patient not taking: Reported on 01/10/2017 03/21/14   Rodolph Bong, MD  dicyclomine (BENTYL) 20 MG tablet Take 1 tablet (20 mg total) by mouth 2 (two) times daily. 01/10/17   Joy, Shawn C, PA-C  diphenoxylate-atropine (LOMOTIL) 2.5-0.025 MG tablet Take 1 tablet by mouth 4 (four) times daily as needed for diarrhea or loose stools. 02/03/15   Elson Areas, PA-C  fluticasone (FLONASE) 50 MCG/ACT nasal spray Place 1 spray into both nostrils daily. 09/01/16   Hedges,  Tinnie Gens, PA-C  loratadine (CLARITIN) 10 MG tablet Take 1 tablet (10 mg total) by mouth daily. 09/01/16   Hedges, Tinnie Gens, PA-C  loratadine (CLARITIN) 10 MG tablet Take 1 tablet (10 mg total) by mouth daily. 05/21/17   Caccavale, Sophia, PA-C  meloxicam (MOBIC) 15 MG tablet Take 1 tablet (15 mg total) by mouth daily. For 1 week, then as needed for pain 09/03/15   Charm Rings, MD  naproxen (NAPROSYN) 500 MG tablet Take 1 tablet (500 mg total) by mouth 2 (two) times daily. Patient not taking: Reported on 01/10/2017 11/04/14   [name], PA-C  ondansetron (ZOFRAN ODT) 4 MG disintegrating tablet Take 1 tablet (4 mg total) by mouth every 8 (eight) hours as needed for nausea or vomiting. 02/03/15   Elson Areas, PA-C    Family History No family history on file.  Social History Social History   Tobacco Use  . Smoking status: Never Smoker  . Smokeless tobacco: Never Used  Substance Use Topics  . Alcohol use: No  . Drug use: No     Allergies   Patient has no known allergies.   Review of Systems Review of Systems  Constitutional: Negative for chills and fever.  HENT: Positive for congestion and sore throat. Negative for ear pain, trouble swallowing and voice change.   Respiratory: Positive for cough. Negative for shortness of breath.      Physical Exam Updated Vital Signs  BP 123/61 (BP Location: Right Arm)   Pulse 60   Temp 97.7 F (36.5 C) (Oral)   Resp 16   SpO2 99%   Physical Exam  Constitutional: He appears well-developed and well-nourished. No distress.  Well-appearing, tolerating secretions.  HENT:  Head: Normocephalic and atraumatic.  Right Ear: Tympanic membrane, external ear and ear canal normal.  Left Ear: Tympanic membrane, external ear and ear canal normal.  Mouth/Throat: Uvula is midline. No trismus in the jaw. No uvula swelling. Posterior oropharyngeal erythema present. No posterior oropharyngeal edema. No tonsillar exudate.  Nose is congested  Eyes:  Conjunctivae are normal.  Neck: Normal range of motion. Neck supple.  Cardiovascular: Normal rate, regular rhythm, normal heart sounds and intact distal pulses.  Pulmonary/Chest: Effort normal and breath sounds normal. No stridor. No respiratory distress. He has no wheezes. He has no rales.  Lymphadenopathy:    He has no cervical adenopathy.  Psychiatric: He has a normal mood and affect. His behavior is normal.  Nursing note and vitals reviewed.    ED Treatments / Results  Labs (all labs ordered are listed, but only abnormal results are displayed) Labs Reviewed - No data to display  EKG None  Radiology No results found.  Procedures Procedures (including critical care time)  Medications Ordered in ED Medications - No data to display   Initial Impression / Assessment and Plan / ED Course  I have reviewed the triage vital signs and the nursing notes.  Pertinent labs & imaging results that were available during my care of the patient were reviewed by me and considered in my medical decision making (see chart for details).     Patients symptoms are consistent with URI, likely viral etiology. Afebrile, tolerating secretions.  Lungs clear to auscultation bilaterally. Discussed that antibiotics are not indicated for viral infections. Pt will be discharged with symptomatic treatment.  Verbalizes understanding and is agreeable with plan. Pt is hemodynamically stable & in NAD prior to dc.  Discussed results, findings, treatment and follow up. Patient advised of return precautions. Patient verbalized understanding and agreed with plan.  Final Clinical Impressions(s) / ED Diagnoses   Final diagnoses:  Viral URI    ED Discharge Orders    None       Kasaundra Fahrney, [place]az[place]Jordan N, PA-C 05/30/17 1000    Abelino DerrickMackuen, Courteney Lyn, MD 05/30/17 1537

## 2017-05-30 NOTE — Discharge Instructions (Signed)
Please read instructions below.  You can take acetaminophen/tylenol or ibuprofen/advil as needed for sore throat or body aches. Use a saline nasal spray for congestion. Drink plenty of water.  Follow up with your primary care provider as needed.  Return to the ER for inability to swallow liquids, difficulty breathing, or new or worsening symptoms.

## 2017-08-28 ENCOUNTER — Encounter (HOSPITAL_COMMUNITY): Payer: Self-pay | Admitting: Emergency Medicine

## 2017-08-28 ENCOUNTER — Ambulatory Visit (HOSPITAL_COMMUNITY)
Admission: EM | Admit: 2017-08-28 | Discharge: 2017-08-28 | Disposition: A | Discharge status: Home / Self Care | Payer: BLUE CROSS/BLUE SHIELD | Attending: Internal Medicine | Admitting: Internal Medicine

## 2017-08-28 DIAGNOSIS — R300 Dysuria: Secondary | ICD-10-CM | POA: Insufficient documentation

## 2017-08-28 DIAGNOSIS — Z202 Contact with and (suspected) exposure to infections with a predominantly sexual mode of transmission: Secondary | ICD-10-CM | POA: Insufficient documentation

## 2017-08-28 LAB — POCT URINALYSIS DIP (DEVICE)
Bilirubin Urine: NEGATIVE
Glucose, UA: NEGATIVE mg/dL
Ketones, ur: NEGATIVE mg/dL
Nitrite: NEGATIVE
PH: 5.5 (ref 5.0–8.0)
PROTEIN: 30 mg/dL — AB
Specific Gravity, Urine: >=1.03 (ref 1.005–1.030)
UROBILINOGEN UA: 0.2 mg/dL (ref 0.0–1.0)

## 2017-08-28 MED ORDER — CEFTRIAXONE SODIUM 250 MG IJ SOLR
250.0000 mg | Freq: Once | INTRAMUSCULAR | Status: AC
  Administered 2017-08-28: 250 mg via INTRAMUSCULAR

## 2017-08-28 MED ORDER — CEFTRIAXONE SODIUM 250 MG IJ SOLR
INTRAMUSCULAR | Status: AC
  Filled 2017-08-28: qty 250

## 2017-08-28 MED ORDER — AZITHROMYCIN 250 MG PO TABS
1000.0000 mg | ORAL_TABLET | Freq: Once | ORAL | Status: AC
  Administered 2017-08-28: 1000 mg via ORAL

## 2017-08-28 MED ORDER — AZITHROMYCIN 250 MG PO TABS
ORAL_TABLET | ORAL | Status: AC
  Filled 2017-08-28: qty 4

## 2017-08-28 MED ORDER — LIDOCAINE HCL (PF) 1 % IJ SOLN
INTRAMUSCULAR | Status: AC
  Filled 2017-08-28: qty 2

## 2017-08-28 NOTE — ED Provider Notes (Signed)
[facility]    CSN: [id number] Arrival date & time: 08/28/17  1331     History   Chief Complaint Chief Complaint  Patient presents with  . Exposure to STD    HPI Seth Steele is a 22 y.o. male.   Seth Steele presents with complaints of burning with urination which started yesterday. No penile discharge. No sores or lesions. Sexually active with one partner, condom broke in last encounter a few days ago. No blood in urine. No frequency or urgency. No back pain or abdominal pain. Per chart review has had both gonorrhea and chlamydia in the past. Patient denies any known STD history. Unknown if partner has any std's. Without contributing medical history.     ROS per HPI.      History reviewed. No pertinent past medical history.  There are no active problems to display for this patient.   Past Surgical History:  Procedure Laterality Date  . HAND SURGERY    . SHOULDER ARTHROSCOPY Right        Home Medications    Prior to Admission medications   Medication Sig Start Date End Date Taking? Authorizing Provider  bismuth subsalicylate (PEPTO BISMOL) 262 MG/15ML suspension Take 30 mLs by mouth every 6 (six) hours as needed for indigestion.    [provider]  calcium carbonate (TUMS - DOSED IN MG ELEMENTAL CALCIUM) 500 MG chewable tablet Chew 1 tablet by mouth daily as needed for indigestion or heartburn.    [provider]  diclofenac (VOLTAREN) 75 MG EC tablet Take 1 tablet (75 mg total) by mouth 2 (two) times daily as needed. Patient not taking: Reported on 01/10/2017 03/21/14   Rodolph Bong, MD  dicyclomine (BENTYL) 20 MG tablet Take 1 tablet (20 mg total) by mouth 2 (two) times daily. Patient not taking: Reported on 08/28/2017 01/10/17   Anselm Pancoast, PA-C  diphenoxylate-atropine (LOMOTIL) 2.5-0.025 MG tablet Take 1 tablet by mouth 4 (four) times daily as needed for diarrhea or loose stools. Patient not taking: Reported on 08/28/2017 02/03/15   Elson Areas, PA-C  fluticasone [facility]) 50 MCG/ACT nasal spray Place 1 spray into both nostrils daily. Patient not taking: Reported on 08/28/2017 09/01/16   Hedges, Tinnie Gens, PA-C  loratadine (CLARITIN) 10 MG tablet Take 1 tablet (10 mg total) by mouth daily. Patient not taking: Reported on 08/28/2017 09/01/16   Hedges, Tinnie Gens, PA-C  loratadine (CLARITIN) 10 MG tablet Take 1 tablet (10 mg total) by mouth daily. Patient not taking: Reported on 08/28/2017 05/21/17   Caccavale, Sophia, PA-C  meloxicam (MOBIC) 15 MG tablet Take 1 tablet (15 mg total) by mouth daily. For 1 week, then as needed for pain 09/03/15   Charm Rings, MD  naproxen (NAPROSYN) 500 MG tablet Take 1 tablet (500 mg total) by mouth 2 (two) times daily. Patient not taking: Reported on 01/10/2017 11/04/14   [name], PA-C  ondansetron (ZOFRAN ODT) 4 MG disintegrating tablet Take 1 tablet (4 mg total) by mouth every 8 (eight) hours as needed for nausea or vomiting. Patient not taking: Reported on 08/28/2017 02/03/15   Osie Cheeks    Family History No family history on file.  Social History Social History   Tobacco Use  . Smoking status: Never Smoker  . Smokeless tobacco: Never Used  Substance Use Topics  . Alcohol use: No  . Drug use: No     Allergies   Patient has no known allergies.   Review of Systems  Review of Systems   Physical Exam Triage Vital Signs ED Triage Vitals [08/28/17 1443]  Enc Vitals Group     BP 115/60     Pulse Rate (!) 58     Resp 16     Temp 97.9 F (36.6 C)     Temp Source Oral     SpO2 100 %     Weight      Height      Head Circumference      Peak Flow      Pain Score      Pain Loc      Pain Edu?      Excl. in GC?    No data found.  Updated Vital Signs BP 115/60 (BP Location: Left Arm)   Pulse (!) 58   Temp 97.9 F (36.6 C) (Oral)   Resp 16   SpO2 100%   Visual Acuity Right Eye Distance:   Left Eye Distance:   Bilateral Distance:    Right Eye Near:     Left Eye Near:    Bilateral Near:     Physical Exam  Constitutional: He is oriented to person, place, and time. He appears well-developed and well-nourished.  Cardiovascular: Normal rate and regular rhythm.  Pulmonary/Chest: Effort normal and breath sounds normal.  Abdominal: There is no tenderness.  Genitourinary:  Genitourinary Comments: Denies sores, lesions, redness or swelling, exam deferred.   Neurological: He is alert and oriented to person, place, and time.  Skin: Skin is warm and dry.     UC Treatments / Results  Labs (all labs ordered are listed, but only abnormal results are displayed) Labs Reviewed  POCT URINALYSIS DIP (DEVICE) - Abnormal; Notable for the following components:      Result Value   Hgb urine dipstick TRACE (*)    Protein, ur 30 (*)    Leukocytes, UA SMALL (*)    All other components within normal limits  URINE CULTURE  URINE CYTOLOGY ANCILLARY ONLY    EKG None  Radiology No results found.  Procedures Procedures (including critical care time)  Medications Ordered in UC Medications  cefTRIAXone (ROCEPHIN) injection 250 mg (has no administration in time range)  azithromycin (ZITHROMAX) tablet 1,000 mg (has no administration in time range)    Initial Impression / Assessment and Plan / UC Course  I have reviewed the triage vital signs and the nursing notes.  Pertinent labs & imaging results that were available during my care of the patient were reviewed by me and considered in my medical decision making (see chart for details).     leuks to urine, per history likely STD related, urine culture obtained and pending, empiric azithromycin and rocephin provided. Will notify of any positive findings and if any changes to treatment are needed.  Encouraged safe sex practices. Patient verbalized understanding and agreeable to plan.    Final Clinical Impressions(s) / UC Diagnoses   Final diagnoses:  Dysuria  Possible exposure to STD      Discharge Instructions     We have treated you today for gonorrhea and chlamydia.  Please withhold from intercourse for the next week. Please use condoms to prevent STD's.   Will notify you of any positive findings and if any changes to treatment are needed. If any positive test results you will need to notify your partner so they can also be treated.  If symptoms worsen or do not improve in the next week to return to be seen or to  follow up with your PCP.     ED Prescriptions    None     Controlled Substance Prescriptions [place] Controlled Substance Registry consulted? Not Applicable   Georgetta Haber, NP 08/28/17 [phone]

## 2017-08-28 NOTE — ED Triage Notes (Signed)
Pt states he spent the night with someone and then afterward when he pees it burns.

## 2017-08-28 NOTE — Discharge Instructions (Signed)
We have treated you today for gonorrhea and chlamydia.  Please withhold from intercourse for the next week. Please use condoms to prevent STD's.   Will notify you of any positive findings and if any changes to treatment are needed. If any positive test results you will need to notify your partner so they can also be treated.  If symptoms worsen or do not improve in the next week to return to be seen or to follow up with your PCP.

## 2017-08-29 ENCOUNTER — Telehealth (HOSPITAL_COMMUNITY): Payer: Self-pay

## 2017-08-29 LAB — URINE CULTURE: CULTURE: NO GROWTH

## 2017-08-29 LAB — URINE CYTOLOGY ANCILLARY ONLY
Chlamydia: POSITIVE — AB
Neisseria Gonorrhea: NEGATIVE
TRICH (WINDOWPATH): NEGATIVE

## 2017-08-29 NOTE — Telephone Encounter (Signed)
Chlamydia is positive.  Rx po zithromax 1g #1 dose no refills was sent to the pharmacy of record.  Need to educate patient to please refrain from sexual intercourse for 7 days to give the medicine time to work, sexual partners need to be notified and tested/treated.  Condoms may reduce risk of reinfection.  Recheck or followup with PCP for further evaluation if symptoms are not improving.   [facility] notified. Attempted to reach patient. No answer at this time. Voicemail left.   

## 2017-09-01 ENCOUNTER — Telehealth (HOSPITAL_COMMUNITY): Payer: Self-pay

## 2017-09-01 NOTE — Telephone Encounter (Signed)
Attempted to reach patient x 3. Letter sent. 

## 2017-09-06 ENCOUNTER — Telehealth (HOSPITAL_COMMUNITY): Payer: Self-pay

## 2017-09-06 NOTE — Telephone Encounter (Signed)
Pt called regarding his test results. Originally this RN thought the patient had not been treated and Azithromycin was sent to his pharmacy.  After talking with the patient and looking back in his chart he was [facility]. Pt verbalized understanding of results. Educated on safe sex practices.

## 2018-02-21 ENCOUNTER — Encounter (HOSPITAL_COMMUNITY): Payer: Self-pay | Admitting: Emergency Medicine

## 2018-02-21 ENCOUNTER — Emergency Department (HOSPITAL_COMMUNITY)
Admission: EM | Admit: 2018-02-21 | Discharge: 2018-02-21 | Disposition: A | Discharge status: Home / Self Care | Payer: Self-pay | Attending: Emergency Medicine | Admitting: Emergency Medicine

## 2018-02-21 ENCOUNTER — Other Ambulatory Visit: Payer: Self-pay

## 2018-02-21 DIAGNOSIS — N4889 Other specified disorders of penis: Secondary | ICD-10-CM | POA: Insufficient documentation

## 2018-02-21 DIAGNOSIS — N5089 Other specified disorders of the male genital organs: Secondary | ICD-10-CM

## 2018-02-21 LAB — RAPID HIV SCREEN (HIV 1/2 AB+AG)
HIV 1/2 Antibodies: NONREACTIVE
HIV-1 P24 Antigen - HIV24: NONREACTIVE

## 2018-02-21 MED ORDER — VALACYCLOVIR HCL 1 G PO TABS: 1000.0000 mg | ORAL_TABLET | Freq: Three times a day (TID) | ORAL | 0 refills | Status: AC

## 2018-02-21 NOTE — ED Provider Notes (Signed)
[facility] Note   CSN: [id number] Arrival date & time: 02/21/18  1128     History   Chief Complaint Chief Complaint  Patient presents with  . Rash    HPI Seth Steele is a 23 y.o. male presenting to the ED today with chief complaint of rash on his penis x 2 days. He reports the rash is painful and itches. The pain is localized to the rash and does not radiate. He has not taken for anything for the pain prior to arrival. He admits to unprotected intercourse with his girlfriend. He denies history of similar rash.   Denies penile discharge, fever, chills, dysuria, hematuria, abdominal pain, urinary symptoms.  History reviewed. No pertinent past medical history.  There are no active problems to display for this patient.   Past Surgical History:  Procedure Laterality Date  . HAND SURGERY    . SHOULDER ARTHROSCOPY Right         Home Medications    Prior to Admission medications   Medication Sig Start Date End Date Taking? Authorizing Provider  bismuth subsalicylate (PEPTO BISMOL) 262 MG/15ML suspension Take 30 mLs by mouth every 6 (six) hours as needed for indigestion.    [provider]  calcium carbonate (TUMS - DOSED IN MG ELEMENTAL CALCIUM) 500 MG chewable tablet Chew 1 tablet by mouth daily as needed for indigestion or heartburn.    [provider]  diclofenac (VOLTAREN) 75 MG EC tablet Take 1 tablet (75 mg total) by mouth 2 (two) times daily as needed. Patient not taking: Reported on 01/10/2017 03/21/14   Rodolph Bongorey, Evan S, MD  dicyclomine (BENTYL) 20 MG tablet Take 1 tablet (20 mg total) by mouth 2 (two) times daily. Patient not taking: Reported on 08/28/2017 01/10/17   Anselm PancoastJoy, Shawn C, PA-C  diphenoxylate-atropine (LOMOTIL) 2.5-0.025 MG tablet Take 1 tablet by mouth 4 (four) times daily as needed for diarrhea or loose stools. Patient not taking: Reported on 08/28/2017 02/03/15   Elson AreasSofia, Leslie K, PA-C  fluticasone  [facility](FLONASE) 50 MCG/ACT nasal spray Place 1 spray into both nostrils daily. Patient not taking: Reported on 08/28/2017 09/01/16   Hedges, Tinnie GensJeffrey, PA-C  loratadine (CLARITIN) 10 MG tablet Take 1 tablet (10 mg total) by mouth daily. Patient not taking: Reported on 08/28/2017 09/01/16   Hedges, Tinnie GensJeffrey, PA-C  loratadine (CLARITIN) 10 MG tablet Take 1 tablet (10 mg total) by mouth daily. Patient not taking: Reported on 08/28/2017 05/21/17   Caccavale, Sophia, PA-C  meloxicam (MOBIC) 15 MG tablet Take 1 tablet (15 mg total) by mouth daily. For 1 week, then as needed for pain 09/03/15   Charm RingsHonig, Erin J, MD  naproxen (NAPROSYN) 500 MG tablet Take 1 tablet (500 mg total) by mouth 2 (two) times daily. Patient not taking: Reported on 01/10/2017 11/04/14   Santiago GladLaisure, Heather, PA-C  ondansetron (ZOFRAN ODT) 4 MG disintegrating tablet Take 1 tablet (4 mg total) by mouth every 8 (eight) hours as needed for nausea or vomiting. Patient not taking: Reported on 08/28/2017 02/03/15   Elson AreasSofia, Leslie K, PA-C  valACYclovir (VALTREX) 1000 MG tablet Take 1 tablet (1,000 mg total) by mouth 3 (three) times daily for 7 days. 02/21/18 02/28/18  Areil Ottey, Caroleen HammanKaitlyn E, PA-C    Family History No family history on file.  Social History Social History   Tobacco Use  . Smoking status: Never Smoker  . Smokeless tobacco: Never Used  Substance Use Topics  . Alcohol use: No  . Drug use:  No     Allergies   Patient has no known allergies.   Review of Systems Review of Systems  Constitutional: Negative for chills and fever.  HENT: Negative for congestion, sinus pressure and sore throat.   Eyes: Negative for pain and visual disturbance.  Respiratory: Negative for shortness of breath.   Cardiovascular: Negative for chest pain and palpitations.  Gastrointestinal: Negative for abdominal pain, diarrhea, nausea and vomiting.  Genitourinary: Positive for genital sores. Negative for difficulty urinating, discharge, dysuria, frequency, hematuria,  penile pain, penile swelling, scrotal swelling and testicular pain.  Musculoskeletal: Negative for arthralgias.  Skin: Negative for rash and wound.  Neurological: Negative for weakness and headaches.     Physical Exam Updated Vital Signs BP 114/69 (BP Location: Right Arm)   Pulse (!) 58   Temp 98.3 F (36.8 C)   Resp 16   SpO2 100%   Physical Exam Vitals signs and nursing note reviewed. Exam conducted with a chaperone present.  Constitutional:      Appearance: He is not ill-appearing.  HENT:     Head: Normocephalic and atraumatic.     Nose: Nose normal.     Mouth/Throat:     Mouth: Mucous membranes are moist.     Pharynx: Oropharynx is clear.  Eyes:     Conjunctiva/sclera: Conjunctivae normal.  Neck:     Musculoskeletal: Normal range of motion.  Cardiovascular:     Rate and Rhythm: Regular rhythm.     Pulses: Normal pulses.     Heart sounds: Normal heart sounds.  Pulmonary:     Effort: Pulmonary effort is normal.     Breath sounds: Normal breath sounds.  Abdominal:     Palpations: Abdomen is soft.     Tenderness: There is no abdominal tenderness. There is no guarding or rebound.  Genitourinary:    Comments: Multiple vesicular lesions grouped together on the shaft of the penis without central umbilication.  Musculoskeletal: Normal range of motion.  Skin:    General: Skin is warm and dry.     Capillary Refill: Capillary refill takes less than 2 seconds.  Neurological:     Mental Status: He is alert. Mental status is at baseline.     Motor: No weakness.  Psychiatric:        Mood and Affect: Mood normal.        Behavior: Behavior normal.        Judgment: Judgment normal.      ED Treatments / Results  Labs (all labs ordered are listed, but only abnormal results are displayed) Labs Reviewed  RAPID HIV SCREEN (HIV 1/2 AB+AG)  RPR  GC/CHLAMYDIA PROBE AMP ([place]    EKG None  Radiology No results found.  Procedures Procedures  (including critical care time)  Medications Ordered in ED Medications - No data to display   Initial Impression / Assessment and Plan / ED Course  I have reviewed the triage vital signs and the nursing notes.  Pertinent labs & imaging results that were available during my care of the patient were reviewed by me and considered in my medical decision making (see chart for details).  Pt presents with rash on his penis, DDX includes herpes, molluscum contagiosum. Pt does not have discharge or dysuria at this time and is not concerned about STDs. Tested for test for HIV and syphilis, urethral swab sent for gonorrhea and chlamydia.  The rash is painful and does not have central umbilication making molluscum contagiosum less  likely. Pt treated for herpes with Valtrex. Pt understands that he has GC/Chlamydia cultures pending and that they will need to inform all sexual partners if results return positive. Discussed importance of using protection when sexually active. Pt is stable at discharge. He is agreeable to the plan and has no further questions. Strict return precautions given.    The patient was discussed with and seen by Dr.Long who agrees with the treatment plan.     Final Clinical Impressions(s) / ED Diagnoses   Final diagnoses:  Genital lesion, male    ED Discharge Orders         Ordered    valACYclovir (VALTREX) 1000 MG tablet  3 times daily     02/21/18 1243           Merica Prell, [place], PA-C 02/21/18 2251    Maia Plan, MD 02/22/18 [phone]

## 2018-02-21 NOTE — Discharge Instructions (Addendum)
You have been seen today for rash. Please read and follow all provided instructions.   Results of your gonorrhea and chlamydia, herpes, and syphilis tests are pending and you will be notified if they are positive. It is very important to practice safe sex and use condoms when sexually active. If your results are positive you need to notify all sexual partners so they can be treated as well. Follow up with your doctor in regards to today's visit.     1. Medications: Prescription for Valtrex sent to your pharmacy, continue usual home medications 2. Treatment: sexual intercourse not recommended until the rash resolves 3. Follow Up: Please follow up with your primary doctor in 2-5 days for discussion of your diagnoses and further evaluation after today's visit; if you do not have a primary care doctor use the resource guide provided to find one; Please return to the ER for any new or worsening symptoms. Please obtain all of your results from medical records or have your doctors office obtain the results - share them with your doctor - you should be seen at your doctors office. Call today to arrange your follow up.   Take medications as prescribed. Please review all of the medicines and only take them if you do not have an allergy to them. Return to the emergency room for worsening condition or new concerning symptoms. Follow up with your regular doctor. If you don't have a regular doctor use one of the numbers below to establish a primary care doctor.  ?  It is also a possibility that you have an allergic reaction to any of the medicines that you have been prescribed - Everybody reacts differently to medications and while MOST people have no trouble with most medicines, you may have a reaction such as nausea, vomiting, rash, swelling, shortness of breath. If this is the case, please stop taking the medicine immediately and contact your physician.  ?  You should return to the ER if you develop severe or  worsening symptoms.    Establishment with a Primary Care provider is Very important for general health care concerns, minor illness and minor injury.

## 2018-02-21 NOTE — ED Triage Notes (Signed)
Itchy rash on penis x 3 days and has not been hungry

## 2018-02-22 LAB — GC/CHLAMYDIA PROBE AMP (~~LOC~~) NOT AT ARMC
Chlamydia: NEGATIVE
Neisseria Gonorrhea: NEGATIVE

## 2018-02-22 LAB — RPR: RPR Ser Ql: NONREACTIVE
